# Patient Record
Sex: Male | Born: 1985 | Hispanic: No | Marital: Single | Smoking: Never smoker
Health system: Southern US, Community
[De-identification: ages and names within clinical notes are randomized; demographics above are authoritative.]

## PROBLEM LIST (undated history)

## (undated) DIAGNOSIS — R000 Tachycardia, unspecified: Secondary | ICD-10-CM

## (undated) DIAGNOSIS — K760 Fatty (change of) liver, not elsewhere classified: Secondary | ICD-10-CM

## (undated) DIAGNOSIS — K219 Gastro-esophageal reflux disease without esophagitis: Secondary | ICD-10-CM

## (undated) DIAGNOSIS — G43909 Migraine, unspecified, not intractable, without status migrainosus: Secondary | ICD-10-CM

## (undated) DIAGNOSIS — R7401 Elevation of levels of liver transaminase levels: Secondary | ICD-10-CM

## (undated) DIAGNOSIS — F5102 Adjustment insomnia: Secondary | ICD-10-CM

## (undated) DIAGNOSIS — Z6836 Body mass index (BMI) 36.0-36.9, adult: Secondary | ICD-10-CM

## (undated) DIAGNOSIS — R0789 Other chest pain: Secondary | ICD-10-CM

## (undated) DIAGNOSIS — J45909 Unspecified asthma, uncomplicated: Secondary | ICD-10-CM

## (undated) HISTORY — DX: Elevation of levels of liver transaminase levels: R74.01

## (undated) HISTORY — DX: Adjustment insomnia: F51.02

## (undated) HISTORY — DX: Body mass index (BMI) 36.0-36.9, adult: Z68.36

## (undated) HISTORY — DX: Other chest pain: R07.89

## (undated) HISTORY — DX: Fatty (change of) liver, not elsewhere classified: K76.0

## (undated) HISTORY — DX: Gastro-esophageal reflux disease without esophagitis: K21.9

## (undated) HISTORY — PX: NO PAST SURGERIES: SHX2092

## (undated) HISTORY — DX: Tachycardia, unspecified: R00.0

---

## 2016-03-23 DIAGNOSIS — Z23 Encounter for immunization: Secondary | ICD-10-CM | POA: Diagnosis not present

## 2016-09-18 DIAGNOSIS — S93431A Sprain of tibiofibular ligament of right ankle, initial encounter: Secondary | ICD-10-CM | POA: Diagnosis not present

## 2016-09-18 DIAGNOSIS — Z1389 Encounter for screening for other disorder: Secondary | ICD-10-CM | POA: Diagnosis not present

## 2016-09-18 DIAGNOSIS — Z6835 Body mass index (BMI) 35.0-35.9, adult: Secondary | ICD-10-CM | POA: Diagnosis not present

## 2016-10-18 DIAGNOSIS — Z6835 Body mass index (BMI) 35.0-35.9, adult: Secondary | ICD-10-CM | POA: Diagnosis not present

## 2016-10-18 DIAGNOSIS — Z131 Encounter for screening for diabetes mellitus: Secondary | ICD-10-CM | POA: Diagnosis not present

## 2016-10-18 DIAGNOSIS — L74510 Primary focal hyperhidrosis, axilla: Secondary | ICD-10-CM | POA: Diagnosis not present

## 2016-10-18 DIAGNOSIS — Z0000 Encounter for general adult medical examination without abnormal findings: Secondary | ICD-10-CM | POA: Diagnosis not present

## 2016-11-01 DIAGNOSIS — Z6835 Body mass index (BMI) 35.0-35.9, adult: Secondary | ICD-10-CM | POA: Diagnosis not present

## 2016-11-01 DIAGNOSIS — K120 Recurrent oral aphthae: Secondary | ICD-10-CM | POA: Diagnosis not present

## 2016-11-01 DIAGNOSIS — J309 Allergic rhinitis, unspecified: Secondary | ICD-10-CM | POA: Diagnosis not present

## 2016-12-21 DIAGNOSIS — Z23 Encounter for immunization: Secondary | ICD-10-CM | POA: Diagnosis not present

## 2017-03-18 DIAGNOSIS — J019 Acute sinusitis, unspecified: Secondary | ICD-10-CM | POA: Diagnosis not present

## 2017-03-18 DIAGNOSIS — L02415 Cutaneous abscess of right lower limb: Secondary | ICD-10-CM | POA: Diagnosis not present

## 2017-07-09 DIAGNOSIS — Z6835 Body mass index (BMI) 35.0-35.9, adult: Secondary | ICD-10-CM | POA: Diagnosis not present

## 2017-07-09 DIAGNOSIS — M79671 Pain in right foot: Secondary | ICD-10-CM | POA: Diagnosis not present

## 2017-07-09 DIAGNOSIS — M7731 Calcaneal spur, right foot: Secondary | ICD-10-CM | POA: Diagnosis not present

## 2017-07-16 DIAGNOSIS — M722 Plantar fascial fibromatosis: Secondary | ICD-10-CM | POA: Diagnosis not present

## 2017-07-22 DIAGNOSIS — R262 Difficulty in walking, not elsewhere classified: Secondary | ICD-10-CM | POA: Diagnosis not present

## 2017-07-22 DIAGNOSIS — M25571 Pain in right ankle and joints of right foot: Secondary | ICD-10-CM | POA: Diagnosis not present

## 2017-08-09 DIAGNOSIS — R262 Difficulty in walking, not elsewhere classified: Secondary | ICD-10-CM | POA: Diagnosis not present

## 2017-08-09 DIAGNOSIS — M25571 Pain in right ankle and joints of right foot: Secondary | ICD-10-CM | POA: Diagnosis not present

## 2017-08-14 DIAGNOSIS — M722 Plantar fascial fibromatosis: Secondary | ICD-10-CM | POA: Diagnosis not present

## 2017-09-12 DIAGNOSIS — R262 Difficulty in walking, not elsewhere classified: Secondary | ICD-10-CM | POA: Diagnosis not present

## 2017-09-12 DIAGNOSIS — M25571 Pain in right ankle and joints of right foot: Secondary | ICD-10-CM | POA: Diagnosis not present

## 2017-09-17 DIAGNOSIS — M722 Plantar fascial fibromatosis: Secondary | ICD-10-CM | POA: Diagnosis not present

## 2017-10-07 DIAGNOSIS — R112 Nausea with vomiting, unspecified: Secondary | ICD-10-CM | POA: Diagnosis not present

## 2017-10-07 DIAGNOSIS — Z6833 Body mass index (BMI) 33.0-33.9, adult: Secondary | ICD-10-CM | POA: Diagnosis not present

## 2017-10-07 DIAGNOSIS — R197 Diarrhea, unspecified: Secondary | ICD-10-CM | POA: Diagnosis not present

## 2017-10-07 DIAGNOSIS — R1013 Epigastric pain: Secondary | ICD-10-CM | POA: Diagnosis not present

## 2018-03-27 DIAGNOSIS — R221 Localized swelling, mass and lump, neck: Secondary | ICD-10-CM

## 2018-03-27 DIAGNOSIS — R09A2 Foreign body sensation, throat: Secondary | ICD-10-CM

## 2018-03-27 HISTORY — DX: Localized swelling, mass and lump, neck: R22.1

## 2018-03-27 HISTORY — DX: Foreign body sensation, throat: R09.A2

## 2019-07-21 DIAGNOSIS — K219 Gastro-esophageal reflux disease without esophagitis: Secondary | ICD-10-CM

## 2019-07-21 DIAGNOSIS — H93232 Hyperacusis, left ear: Secondary | ICD-10-CM | POA: Insufficient documentation

## 2019-07-21 HISTORY — DX: Hyperacusis, left ear: H93.232

## 2019-07-21 HISTORY — DX: Gastro-esophageal reflux disease without esophagitis: K21.9

## 2019-12-05 ENCOUNTER — Encounter (HOSPITAL_BASED_OUTPATIENT_CLINIC_OR_DEPARTMENT_OTHER): Payer: Self-pay | Admitting: Emergency Medicine

## 2019-12-05 ENCOUNTER — Emergency Department (HOSPITAL_BASED_OUTPATIENT_CLINIC_OR_DEPARTMENT_OTHER)
Admission: EM | Admit: 2019-12-05 | Discharge: 2019-12-06 | Disposition: A | Discharge status: Home / Self Care | Payer: 59 | Attending: Emergency Medicine | Admitting: Emergency Medicine

## 2019-12-05 ENCOUNTER — Other Ambulatory Visit: Payer: Self-pay

## 2019-12-05 ENCOUNTER — Emergency Department (HOSPITAL_BASED_OUTPATIENT_CLINIC_OR_DEPARTMENT_OTHER): Payer: 59

## 2019-12-05 DIAGNOSIS — R110 Nausea: Secondary | ICD-10-CM | POA: Diagnosis not present

## 2019-12-05 DIAGNOSIS — Z8709 Personal history of other diseases of the respiratory system: Secondary | ICD-10-CM | POA: Diagnosis not present

## 2019-12-05 DIAGNOSIS — R079 Chest pain, unspecified: Secondary | ICD-10-CM | POA: Diagnosis present

## 2019-12-05 DIAGNOSIS — R509 Fever, unspecified: Secondary | ICD-10-CM | POA: Diagnosis not present

## 2019-12-05 DIAGNOSIS — R300 Dysuria: Secondary | ICD-10-CM | POA: Insufficient documentation

## 2019-12-05 DIAGNOSIS — B349 Viral infection, unspecified: Secondary | ICD-10-CM | POA: Diagnosis not present

## 2019-12-05 DIAGNOSIS — M791 Myalgia, unspecified site: Secondary | ICD-10-CM | POA: Diagnosis not present

## 2019-12-05 HISTORY — DX: Unspecified asthma, uncomplicated: J45.909

## 2019-12-05 HISTORY — DX: Migraine, unspecified, not intractable, without status migrainosus: G43.909

## 2019-12-05 LAB — BASIC METABOLIC PANEL
Anion gap: 10 (ref 5–15)
BUN: 11 mg/dL (ref 6–20)
CO2: 24 mmol/L (ref 22–32)
Calcium: 8.4 mg/dL — ABNORMAL LOW (ref 8.9–10.3)
Chloride: 105 mmol/L (ref 98–111)
Creatinine, Ser: 0.98 mg/dL (ref 0.61–1.24)
GFR calc Af Amer: >60 mL/min (ref >60)
GFR calc non Af Amer: >60 mL/min (ref >60)
Glucose, Bld: 91 mg/dL (ref 70–99)
Potassium: 3.1 mmol/L — ABNORMAL LOW (ref 3.5–5.1)
Sodium: 139 mmol/L (ref 135–145)

## 2019-12-05 LAB — HEPATIC FUNCTION PANEL
ALT: 46 U/L — ABNORMAL HIGH (ref 0–44)
AST: 30 U/L (ref 15–41)
Albumin: 4.3 g/dL (ref 3.5–5.0)
Alkaline Phosphatase: 64 U/L (ref 38–126)
Bilirubin, Direct: <0.1 mg/dL (ref 0.0–0.2)
Total Bilirubin: 0.5 mg/dL (ref 0.3–1.2)
Total Protein: 7.6 g/dL (ref 6.5–8.1)

## 2019-12-05 LAB — CBC
HCT: 43.4 % (ref 39.0–52.0)
Hemoglobin: 14.5 g/dL (ref 13.0–17.0)
MCH: 29.7 pg (ref 26.0–34.0)
MCHC: 33.4 g/dL (ref 30.0–36.0)
MCV: 88.8 fL (ref 80.0–100.0)
Platelets: 264 10*3/uL (ref 150–400)
RBC: 4.89 MIL/uL (ref 4.22–5.81)
RDW: 12.4 % (ref 11.5–15.5)
WBC: 8 10*3/uL (ref 4.0–10.5)
nRBC: 0 % (ref 0.0–0.2)

## 2019-12-05 LAB — LIPASE, BLOOD: Lipase: 35 U/L (ref 11–51)

## 2019-12-05 IMAGING — CR DG CHEST 2V
2 series · 2 of 2 positions shown · non-contrast
Comparison: None.

CLINICAL DATA: Low-grade fever, chest discomfort

EXAM:
CHEST - 2 VIEW

[w chest pa]
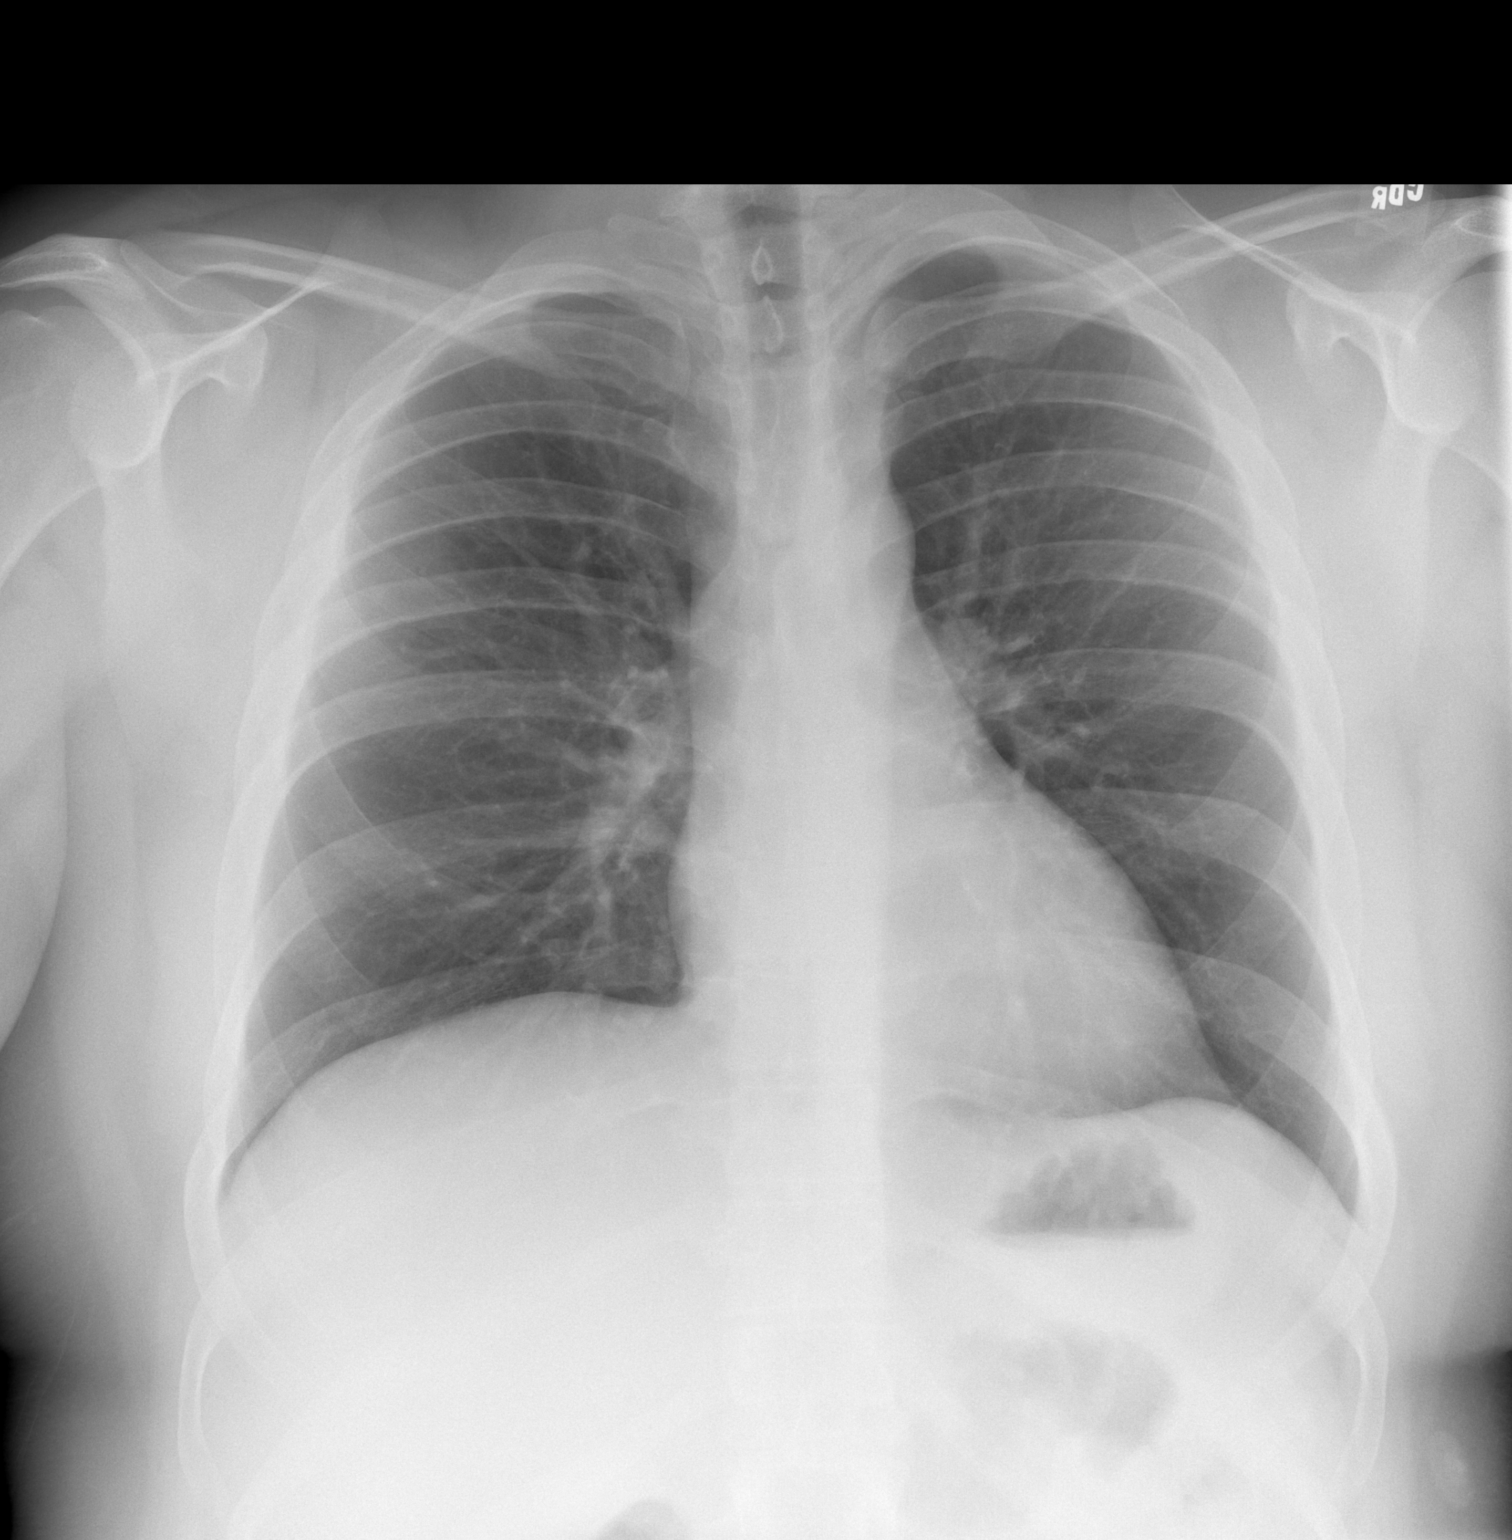

[w chest lat]
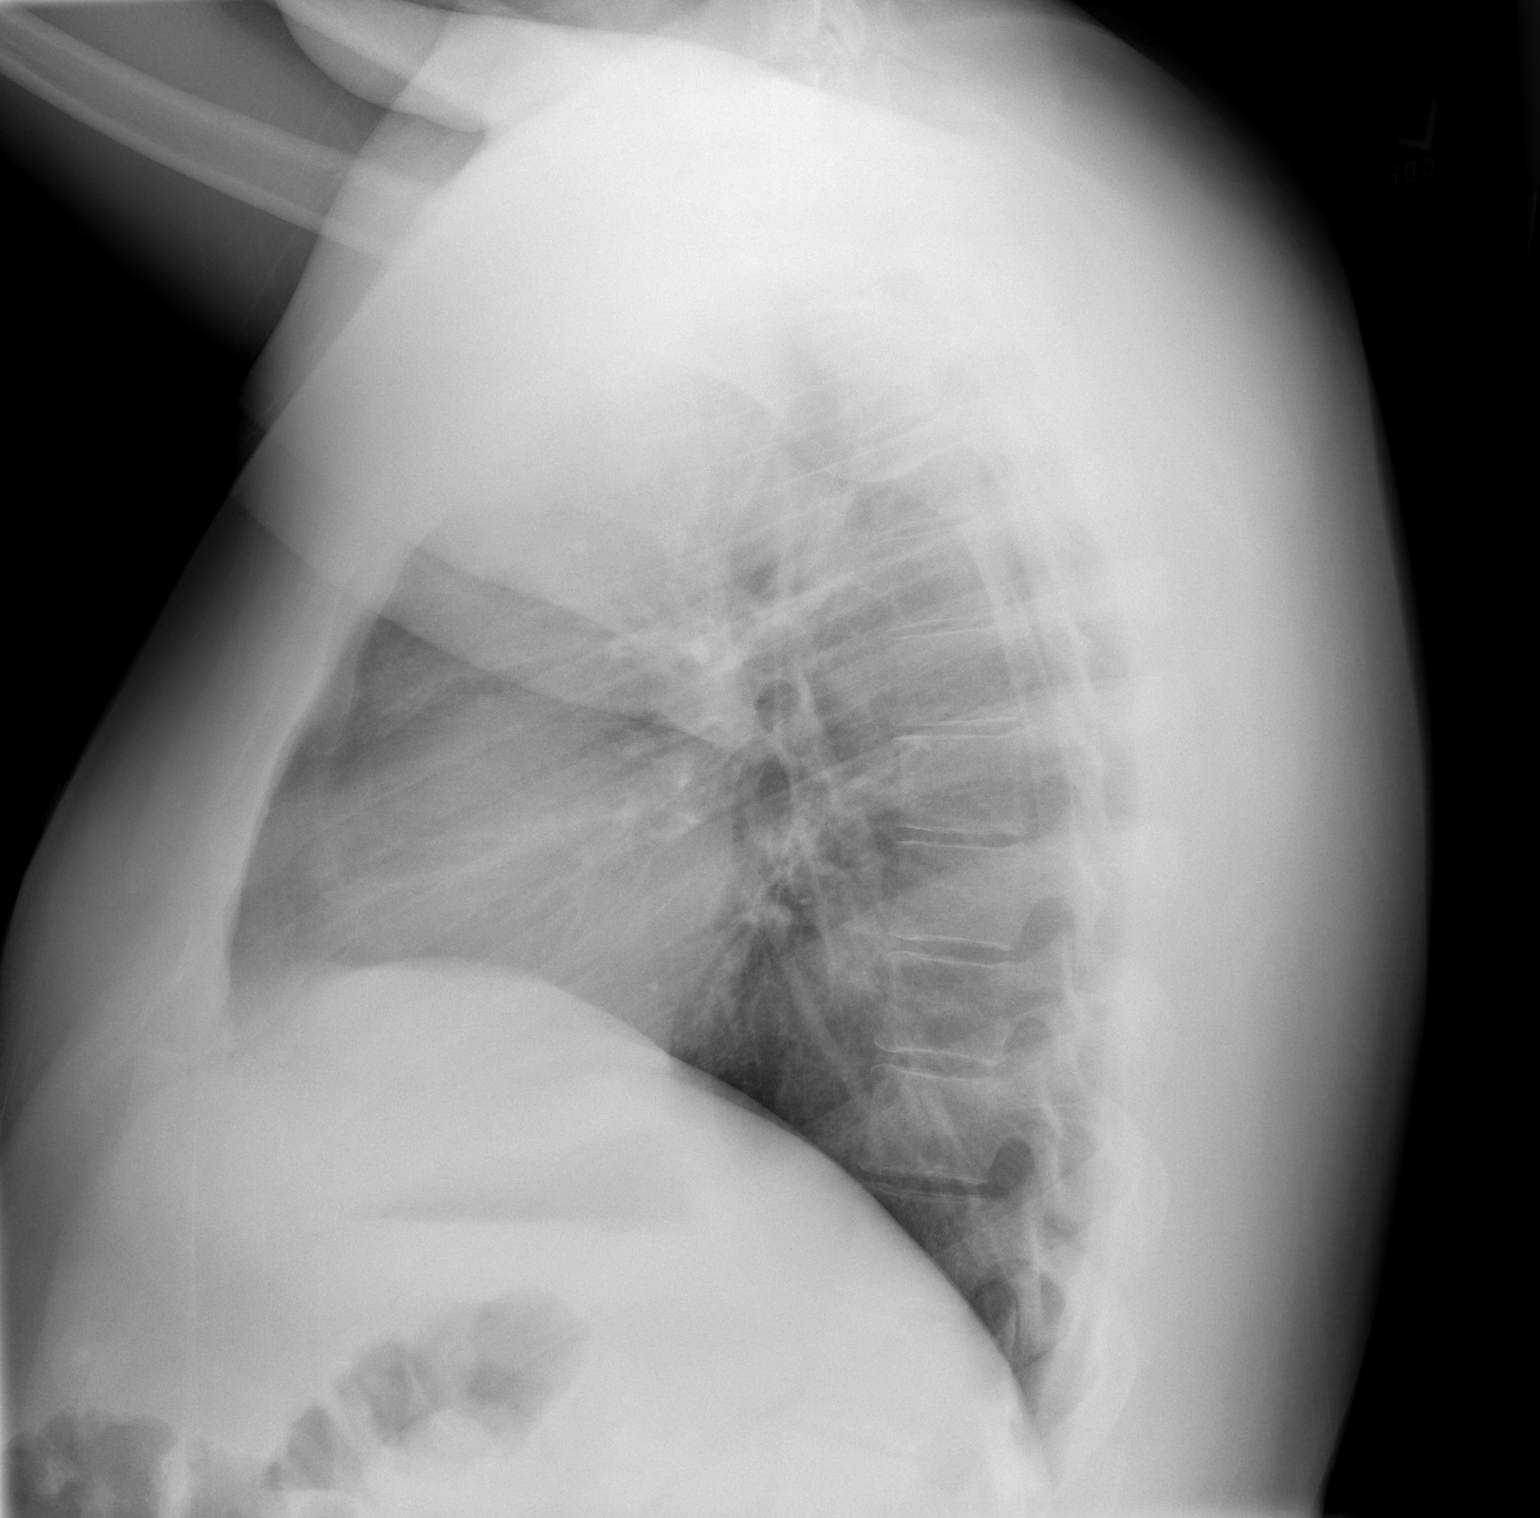

[2 of 2 positions shown; findings below may reference images not displayed]

FINDINGS: The heart size and mediastinal contours are within normal limits.
Both lungs are clear. The visualized skeletal structures are
unremarkable.
IMPRESSION: Normal study.

## 2019-12-05 MED ORDER — SODIUM CHLORIDE 0.9% FLUSH
3.0000 mL | Freq: Once | INTRAVENOUS | Status: DC
  Filled 2019-12-05: qty 3

## 2019-12-05 MED ORDER — LACTATED RINGERS IV BOLUS
1000.0000 mL | Freq: Once | INTRAVENOUS | Status: AC
  Administered 2019-12-05: 1000 mL via INTRAVENOUS

## 2019-12-05 MED ORDER — ONDANSETRON HCL 4 MG/2ML IJ SOLN
4.0000 mg | Freq: Once | INTRAMUSCULAR | Status: AC
  Administered 2019-12-05: 4 mg via INTRAVENOUS
  Filled 2019-12-05: qty 2

## 2019-12-05 NOTE — ED Triage Notes (Addendum)
Chest pressure that started 2 days ago and is intermittent. Pt states his HR has been "high" at home, and a "low grade fever" (temp max 99.2). Also reports 3 episodes of diarrhea today. SHOB that is intermittent but states he does have asthma. Reports muscle weakness and dry mouth.

## 2019-12-05 NOTE — ED Provider Notes (Signed)
[facility] EMERGENCY DEPARTMENT Provider Note   CSN: [id number] Arrival date & time: 12/05/19  1951     History Chief Complaint  Patient presents with  . Chest Pain, Fever    Terry Todd is a 34 y.o. male.  Patient is a 34 year old male with a history of asthma and migraines presenting today with multiple symptoms.  Patient states 3 days ago he developed a migraine headache that seemed a little worse than his typical headache that eventually resolved however yesterday he started having generalized myalgias, arthralgias, poor appetite, nausea.  Today patient has had 3 episodes of diarrhea and now is having chest pain that he describes as pressure that is intermittent and sometimes seems to be worse with laying down, right-sided abdominal pain and occasional dysuria.  He denies cough but has intermittently felt short of breath.  He denies any swelling in his lower extremities or evidence of rash.  No recent medication changes.  Patient has been vaccinated against Covid and has had no known sick contacts.  He denies any bad food exposure and has had no prior abdominal surgeries.  The history is provided by the patient.       Past Medical History:  Diagnosis Date  . Asthma   . Migraine     There are no problems to display for this patient.   History reviewed. No pertinent surgical history.     No family history on file.  Social History   Tobacco Use  . Smoking status: Never Smoker  . Smokeless tobacco: Never Used  Vaping Use  . Vaping Use: Never used  Substance Use Topics  . Alcohol use: Never  . Drug use: Never    Home Medications Prior to Admission medications   Not on File    Allergies    Patient has no known allergies.  Review of Systems   Review of Systems  All other systems reviewed and are negative.   Physical Exam Updated Vital Signs BP (!) 145/109   Pulse 88   Temp 99.1 F (37.3 C) (Oral)   Resp 18   Ht 5\' 11"  (1.803 m)   Wt  117.9 kg   SpO2 100%   BMI 36.26 kg/m   Physical Exam Vitals and nursing note reviewed.  Constitutional:      General: He is not in acute distress.    Appearance: Normal appearance. He is well-developed. He is obese.  HENT:     Head: Normocephalic and atraumatic.     Right Ear: Tympanic membrane normal.     Left Ear: Tympanic membrane normal.     Nose: Nose normal.     Mouth/Throat:     Mouth: Mucous membranes are dry.  Eyes:     Conjunctiva/sclera: Conjunctivae normal.     Pupils: Pupils are equal, round, and reactive to light.  Cardiovascular:     Rate and Rhythm: Normal rate and regular rhythm.     Heart sounds: No murmur heard.   Pulmonary:     Effort: Pulmonary effort is normal. No respiratory distress.     Breath sounds: Normal breath sounds. No wheezing or rales.  Abdominal:     General: There is no distension.     Palpations: Abdomen is soft.     Tenderness: There is abdominal tenderness in the right upper quadrant. There is no guarding or rebound.  Musculoskeletal:        General: No tenderness. Normal range of motion.     Cervical back:  Normal range of motion and neck supple.     Right lower leg: No edema.     Left lower leg: No edema.  Skin:    General: Skin is warm and dry.     Capillary Refill: Capillary refill takes less than 2 seconds.     Findings: No erythema or rash.  Neurological:     General: No focal deficit present.     Mental Status: He is alert and oriented to person, place, and time. Mental status is at baseline.  Psychiatric:        Mood and Affect: Mood normal.        Behavior: Behavior normal.        Thought Content: Thought content normal.     ED Results / Procedures / Treatments   Labs (all labs ordered are listed, but only abnormal results are displayed) Labs Reviewed  BASIC METABOLIC PANEL - Abnormal; Notable for the following components:      Result Value   Potassium 3.1 (*)    Calcium 8.4 (*)    All other components within  normal limits  HEPATIC FUNCTION PANEL - Abnormal; Notable for the following components:   ALT 46 (*)    All other components within normal limits  CBC  LIPASE, BLOOD  TROPONIN I (HIGH SENSITIVITY)  TROPONIN I (HIGH SENSITIVITY)    EKG EKG Interpretation  Date/Time:  Saturday December 05 2019 23:14:41 EDT Ventricular Rate:  87 PR Interval:    QRS Duration: 106 QT Interval:  366 QTC Calculation: 441 R Axis:   115 Text Interpretation: Sinus rhythm Right axis deviation Normal ECG Confirmed by Gwyneth Sprout ([id number]) on 12/06/2019 12:06:20 AM   Radiology DG Chest 2 View  Result Date: 12/05/2019 CLINICAL DATA:  Low-grade fever, chest discomfort EXAM: CHEST - 2 VIEW COMPARISON:  None. FINDINGS: The heart size and mediastinal contours are within normal limits. Both lungs are clear. The visualized skeletal structures are unremarkable. IMPRESSION: Normal study. Electronically Signed   By: Charlett Nose M.D.   On: 12/05/2019 21:12    Procedures Procedures (including critical care time)  Medications Ordered in ED Medications  sodium chloride flush (NS) 0.9 % injection 3 mL (3 mLs Intravenous Not Given 12/05/19 2227)  lactated ringers bolus 1,000 mL (1,000 mLs Intravenous New Bag/Given 12/05/19 2303)  ondansetron (ZOFRAN) injection 4 mg (4 mg Intravenous Given 12/05/19 2257)    ED Course  I have reviewed the triage vital signs and the nursing notes.  Pertinent labs & imaging results that were available during my care of the patient were reviewed by me and considered in my medical decision making (see chart for details).    MDM Rules/Calculators/A&P                          34 year old male presenting today with multiple symptoms seems most classic for viral syndrome.  However patient also has right upper quadrant pain and some intermittent chest pain.  He has had some vomiting and diarrhea and poor oral intake.  Low-grade temperature here of 99 and mild hypertension.  Patient has full  range of motion of neck and no meningeal signs.  He has no rashes or sore throat.  Lungs are clear bilaterally.  Patient has been vaccinated against Covid and low suspicion at this time.  BMP with mild hypokalemia of 3.1 with normal renal function, troponin, hepatic function panel, lipase and EKG are pending.  Patient's chest x-ray is clear.  Patient given IV fluids and Zofran. Labs are reassuring with normal troponin, chest x-ray, CBC.  BMP with mild hypokalemia of 3.1 and discussed increased potassium containing foods with the patient.  ALT slightly elevated but otherwise within normal limits hepatic function panel and lipase within normal limits.  Given all labs are relatively normal suspect this is a viral cause.  Will have patient continue hydration, antiemetics and return if symptoms worsen over the next few days.  MDM Number of Diagnoses or Management Options   Amount and/or Complexity of Data Reviewed Clinical lab tests: ordered and reviewed Tests in the radiology section of CPT: ordered and reviewed Tests in the medicine section of CPT: ordered and reviewed Decide to obtain previous medical records or to obtain history from someone other than the patient: yes Review and summarize past medical records: yes Discuss the patient with other providers: no Independent visualization of images, tracings, or specimens: yes  Risk of Complications, Morbidity, and/or Mortality Presenting problems: moderate Diagnostic procedures: low Management options: low  Patient Progress Patient progress: improved    Final Clinical Impression(s) / ED Diagnoses Final diagnoses:  Acute viral syndrome    Rx / DC Orders ED Discharge Orders    None       Blanchie Dessert, MD 12/07/19 2207

## 2019-12-06 LAB — URINALYSIS, ROUTINE W REFLEX MICROSCOPIC
Bilirubin Urine: NEGATIVE
Glucose, UA: NEGATIVE mg/dL
Hgb urine dipstick: NEGATIVE
Ketones, ur: NEGATIVE mg/dL
Leukocytes,Ua: NEGATIVE
Nitrite: NEGATIVE
Protein, ur: NEGATIVE mg/dL
Specific Gravity, Urine: 1.02 (ref 1.005–1.030)
pH: 6 (ref 5.0–8.0)

## 2019-12-06 LAB — TROPONIN I (HIGH SENSITIVITY)
Troponin I (High Sensitivity): 3 ng/L (ref <18)
Troponin I (High Sensitivity): 3 ng/L (ref <18)

## 2019-12-06 NOTE — ED Notes (Signed)
EDP at bedside  

## 2019-12-06 NOTE — Discharge Instructions (Addendum)
It appears that you are mildly dehydrated today and your potassium is slightly low because of the diarrhea.  Try to eat foods that have lots of potassium and push fluids he did not get dehydrated.  You can use Tylenol as needed for fever and body aches.  This is most likely a viral bug and should get better on its own in the next few days.  However if things worsen or the fever starts getting very high the pain in your abdomen gets worse or any other concerns please return to the emergency room.

## 2020-01-04 ENCOUNTER — Other Ambulatory Visit: Payer: Self-pay

## 2020-01-04 DIAGNOSIS — J45909 Unspecified asthma, uncomplicated: Secondary | ICD-10-CM | POA: Insufficient documentation

## 2020-01-04 DIAGNOSIS — G43909 Migraine, unspecified, not intractable, without status migrainosus: Secondary | ICD-10-CM

## 2020-01-04 HISTORY — DX: Migraine, unspecified, not intractable, without status migrainosus: G43.909

## 2020-01-05 ENCOUNTER — Other Ambulatory Visit: Payer: Self-pay

## 2020-01-05 ENCOUNTER — Encounter: Payer: Self-pay | Admitting: Cardiology

## 2020-01-05 ENCOUNTER — Ambulatory Visit: Payer: 59 | Admitting: Cardiology

## 2020-01-05 VITALS — BP 124/86 | HR 73 | Ht 71.0 in | Wt 267.0 lb

## 2020-01-05 DIAGNOSIS — R0602 Shortness of breath: Secondary | ICD-10-CM

## 2020-01-05 DIAGNOSIS — E669 Obesity, unspecified: Secondary | ICD-10-CM

## 2020-01-05 DIAGNOSIS — R002 Palpitations: Secondary | ICD-10-CM | POA: Insufficient documentation

## 2020-01-05 DIAGNOSIS — I498 Other specified cardiac arrhythmias: Secondary | ICD-10-CM

## 2020-01-05 HISTORY — DX: Other specified cardiac arrhythmias: I49.8

## 2020-01-05 HISTORY — DX: Shortness of breath: R06.02

## 2020-01-05 HISTORY — DX: Obesity, unspecified: E66.9

## 2020-01-05 HISTORY — DX: Palpitations: R00.2

## 2020-01-05 NOTE — Progress Notes (Signed)
Cardiology Office Note:    Date:  01/05/2020   ID:  Terry Todd, DOB [birth year], MRN [id number]  PCP:  Buckner Malta, MD  Cardiologist:  [name], DO  Electrophysiologist:  None   Referring MD: Buckner Malta, MD   " I am having a lot of palpitations"  History of Present Illness:    Terry Todd is a 34 y.o. male with a hx of asthma, anxiety presents today to be evaluated for palpitations.  The patient tells me that for the last several months he has been experiencing palpitations but within the last month it has gotten progressively worse.  His PCP did placed a monitor on him.  And he recently sent the monitor back and is pending results.  We discussed the palpitation abrupt onset of fast heartbeat.  He said he last for few minutes or sometimes he does have shortness of breath associated with it.  He denies any chest pain.  He does tell me that the shortness of breath most times is associated with the palpitations.  Of note the patient's PCP did notice most likely some report of previous right-sided chest pain however the patient has noted that he is not experiencing this any longer.  He tells me recently he has been started on a new anxiety medicine but this does not seem to be helping very much.  Past Medical History:  Diagnosis Date  . Adjustment insomnia   . Asthma   . BMI 36.0-36.9,adult   . Elevated transaminase level   . GERD without esophagitis   . 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 Migraine   . Migraines 01/04/2020  . Right-sided chest wall pain   . Sensation of lump in throat 03/27/2018  . Tachycardia     Past Surgical History:  Procedure Laterality Date  . NO PAST SURGERIES      Current Medications: Current Meds  Medication Sig  . albuterol (VENTOLIN HFA) 108 (90 Base) MCG/ACT inhaler Inhale 2 puffs into the lungs every 4 (four) hours as needed.  . butalbital-acetaminophen-caffeine (FIORICET) 50-325-40 MG tablet Take 1-2 tablets by mouth  every 6 (six) hours as needed.  . cetirizine (ZYRTEC) 10 MG tablet Take 10 mg by mouth at bedtime.  Marland Kitchen escitalopram (LEXAPRO) 5 MG tablet Take 5 mg by mouth daily.  Marland Kitchen omeprazole (PRILOSEC) 40 MG capsule Take 40 mg by mouth daily.  . ondansetron (ZOFRAN-ODT) 8 MG disintegrating tablet Take 8 mg by mouth every 8 (eight) hours as needed.  . terbinafine (LAMISIL) 250 MG tablet Take 250 mg by mouth 2 (two) times daily.     Allergies:   Patient has no known allergies.   Social History   Socioeconomic History  . Marital status: Single    Spouse name: Not on file  . Number of children: Not on file  . Years of education: Not on file  . Highest education level: Not on file  Occupational History  . Not on file  Tobacco Use  . Smoking status: Never Smoker  . Smokeless tobacco: Never Used  Vaping Use  . Vaping Use: Never used  Substance and Sexual Activity  . Alcohol use: Never  . Drug use: Never  . Sexual activity: Not on file  Other Topics Concern  . Not on file  Social History Narrative  . Not on file   Social Determinants of Health   Financial Resource Strain:   . Difficulty of Paying Living Expenses:   Food Insecurity:   .  Worried About Programme researcher, broadcasting/film/videounning Out of Food in the Last Year:   . Baristaan Out of Food in the Last Year:   Transportation Needs:   . Freight forwarderLack of Transportation (Medical):   Marland Kitchen. Lack of Transportation (Non-Medical):   Physical Activity:   . Days of Exercise per Week:   . Minutes of Exercise per Session:   Stress:   . Feeling of Stress :   Social Connections:   . Frequency of Communication with Friends and Family:   . Frequency of Social Gatherings with Friends and Family:   . Attends Religious Services:   . Active Member of Clubs or Organizations:   . Attends BankerClub or Organization Meetings:   Marland Kitchen. Marital Status:      Family History: The patient's family history includes Diabetes Mellitus II in his paternal grandfather and paternal grandmother.  ROS:   Review of Systems   Constitution: Negative for decreased appetite, fever and weight gain.  HENT: Negative for congestion, ear discharge, hoarse voice and sore throat.   Eyes: Negative for discharge, redness, vision loss in right eye and visual halos.  Cardiovascular: Reports dyspnea on exertion and palpitations.  Negative for leg swelling, orthopnea . Respiratory: Negative for cough, hemoptysis, shortness of breath and snoring.   Endocrine: Negative for heat intolerance and polyphagia.  Hematologic/Lymphatic: Negative for bleeding problem. Does not bruise/bleed easily.  Skin: Negative for flushing, nail changes, rash and suspicious lesions.  Musculoskeletal: Negative for arthritis, joint pain, muscle cramps, myalgias, neck pain and stiffness.  Gastrointestinal: Negative for abdominal pain, bowel incontinence, diarrhea and excessive appetite.  Genitourinary: Negative for decreased libido, genital sores and incomplete emptying.  Neurological: Negative for brief paralysis, focal weakness, headaches and loss of balance.  Psychiatric/Behavioral: Negative for altered mental status, depression and suicidal ideas.  Allergic/Immunologic: Negative for HIV exposure and persistent infections.    EKGs/Labs/Other Studies Reviewed:    The following studies were reviewed today:   EKG:  The ekg ordered today demonstrates sinus rhythm, heart rate 70 bpm with arrhythmia.   Recent Labs: 12/05/2019: ALT 46; BUN 11; Creatinine, Ser 0.98; Hemoglobin 14.5; Platelets 264; Potassium 3.1; Sodium 139  Recent Lipid Panel Lipid profile: Triglyceride 163, cholesterol 179, HDL 39, LDL 107  Physical Exam:    VS:  BP 124/86   Pulse 73   Ht 5\' 11"  (1.803 m)   Wt 267 lb (121.1 kg)   SpO2 97%   BMI 37.24 kg/m     Wt Readings from Last 3 Encounters:  01/05/20 267 lb (121.1 kg)  12/05/19 260 lb (117.9 kg)     GEN: Well nourished, well developed in no acute distress HEENT: Normal NECK: No JVD; No carotid bruits LYMPHATICS: No  lymphadenopathy CARDIAC: S1S2 noted,RRR, no murmurs, rubs, gallops RESPIRATORY:  Clear to auscultation without rales, wheezing or rhonchi  ABDOMEN: Soft, non-tender, non-distended, +bowel sounds, no guarding. EXTREMITIES: No edema, No cyanosis, no clubbing MUSCULOSKELETAL:  No deformity  SKIN: Warm and dry NEUROLOGIC:  Alert and oriented x 3, non-focal PSYCHIATRIC:  Normal affect, good insight  ASSESSMENT:    1. Palpitations   2. Sinus arrhythmia   3. Shortness of breath   4. Obesity (BMI 30-39.9)    PLAN:     He recently wore the [employer] monitor and did send this information to the company.  We will try to get this information for review.  For now he defers to be started on any rate reducing agent like propanolol.  Meanwhile while we wait for the monitor, I  will get a transthoracic echocardiogram to assess LV and RV function.  TSH will also be done.  I did educate the patient what it means with the heart when he is in sinus arrhythmia.  All of his questions were answered.  Shortness of breath-echo as noted above.  Obesity-the patient understands the need to lose weight with diet and exercise. We have discussed specific strategies for this.  The patient is in agreement with the above plan. The patient left the office in stable condition.  The patient will follow up in 3 months.   Medication Adjustments/Labs and Tests Ordered: Current medicines are reviewed at length with the patient today.  Concerns regarding medicines are outlined above.  Orders Placed This Encounter  Procedures  . TSH  . EKG 12-Lead  . ECHOCARDIOGRAM COMPLETE   No orders of the defined types were placed in this encounter.   Patient Instructions  Medication Instructions:  No medication changes. *If you need a refill on your cardiac medications before your next appointment, please call your pharmacy*   Lab Work: Your physician recommends that you have a TSH today.  If you have labs (blood work) drawn  today and your tests are completely normal, you will receive your results only by: Marland Kitchen MyChart Message (if you have MyChart) OR . A paper copy in the mail If you have any lab test that is abnormal or we need to change your treatment, we will call you to review the results.   Testing/Procedures: Your physician has requested that you have an echocardiogram. Echocardiography is a painless test that uses sound waves to create images of your heart. It provides your doctor with information about the size and shape of your heart and how well your heart's chambers and valves are working. This procedure takes approximately one hour. There are no restrictions for this procedure.     Follow-Up: At [facility], you and your health needs are our priority.  As part of our continuing mission to provide you with exceptional heart care, we have created designated Provider Care Teams.  These Care Teams include your primary Cardiologist (physician) and Advanced Practice Providers (APPs -  Physician Assistants and Nurse Practitioners) who all work together to provide you with the care you need, when you need it.  We recommend signing up for the patient portal called "MyChart".  Sign up information is provided on this After Visit Summary.  MyChart is used to connect with patients for Virtual Visits (Telemedicine).  Patients are able to view lab/test results, encounter notes, upcoming appointments, etc.  Non-urgent messages can be sent to your provider as well.   To learn more about what you can do with MyChart, go to [email or web address].    Your next appointment:   3 month(s)  The format for your next appointment:   In Person  Provider:   [name], DO   Other Instructions  Echocardiogram An echocardiogram is a procedure that uses painless sound waves (ultrasound) to produce an image of the heart. Images from an echocardiogram can provide important information about:  Signs of coronary artery  disease (CAD).  Aneurysm detection. An aneurysm is a weak or damaged part of an artery wall that bulges out from the normal force of blood pumping through the body.  Heart size and shape. Changes in the size or shape of the heart can be associated with certain conditions, including heart failure, aneurysm, and CAD.  Heart muscle function.  Heart valve function.  Signs of a  past heart attack.  Fluid buildup around the heart.  Thickening of the heart muscle.  A tumor or infectious growth around the heart valves. Tell a health care provider about:  Any allergies you have.  All medicines you are taking, including vitamins, herbs, eye drops, creams, and over-the-counter medicines.  Any blood disorders you have.  Any surgeries you have had.  Any medical conditions you have.  Whether you are pregnant or may be pregnant. What are the risks? Generally, this is a safe procedure. However, problems may occur, including:  Allergic reaction to dye (contrast) that may be used during the procedure. What happens before the procedure? No specific preparation is needed. You may eat and drink normally. What happens during the procedure?   An IV tube may be inserted into one of your veins.  You may receive contrast through this tube. A contrast is an injection that improves the quality of the pictures from your heart.  A gel will be applied to your chest.  A wand-like tool (transducer) will be moved over your chest. The gel will help to transmit the sound waves from the transducer.  The sound waves will harmlessly bounce off of your heart to allow the heart images to be captured in real-time motion. The images will be recorded on a computer. The procedure may vary among health care providers and hospitals. What happens after the procedure?  You may return to your normal, everyday life, including diet, activities, and medicines, unless your health care provider tells you not to do  that. Summary  An echocardiogram is a procedure that uses painless sound waves (ultrasound) to produce an image of the heart.  Images from an echocardiogram can provide important information about the size and shape of your heart, heart muscle function, heart valve function, and fluid buildup around your heart.  You do not need to do anything to prepare before this procedure. You may eat and drink normally.  After the echocardiogram is completed, you may return to your normal, everyday life, unless your health care provider tells you not to do that. This information is not intended to replace advice given to you by your health care provider. Make sure you discuss any questions you have with your health care provider. Document Revised: 10/02/2018 Document Reviewed: 07/14/2016 Elsevier Patient Education  2020 [employer].      Adopting a Healthy Lifestyle.  Know what a healthy weight is for you (roughly BMI <25) and aim to maintain this   Aim for 7+ servings of fruits and vegetables daily   65-80+ fluid ounces of water or unsweet tea for healthy kidneys   Limit to max 1 drink of alcohol per day; avoid smoking/tobacco   Limit animal fats in diet for cholesterol and heart health - choose grass fed whenever available   Avoid highly processed foods, and foods high in saturated/trans fats   Aim for low stress - take time to unwind and care for your mental health   Aim for 150 min of moderate intensity exercise weekly for heart health, and weights twice weekly for bone health   Aim for 7-9 hours of sleep daily   When it comes to diets, agreement about the perfect plan isnt easy to find, even among the experts. Experts at the [facility] [employer] developed an idea known as the Healthy Eating Plate. Just imagine a plate divided into logical, healthy portions.   The emphasis is on diet quality:   Load up on  vegetables and fruits - one-half of your plate: Aim for color and  variety, and remember that potatoes dont count.   Go for whole grains - one-quarter of your plate: Whole wheat, barley, wheat berries, quinoa, oats, brown rice, and foods made with them. If you want pasta, go with whole wheat pasta.   Protein power - one-quarter of your plate: Fish, chicken, beans, and nuts are all healthy, versatile protein sources. Limit red meat.   The diet, however, does go beyond the plate, offering a few other suggestions.   Use healthy plant oils, such as olive, canola, soy, corn, sunflower and peanut. Check the labels, and avoid partially hydrogenated oil, which have unhealthy trans fats.   If youre thirsty, drink water. Coffee and tea are good in moderation, but skip sugary drinks and limit milk and dairy products to one or two daily servings.   The type of carbohydrate in the diet is more important than the amount. Some sources of carbohydrates, such as vegetables, fruits, whole grains, and beans-are healthier than others.   Finally, stay active  Signed, [name], DO  01/05/2020 8:03 PM    [facility]

## 2020-01-05 NOTE — Patient Instructions (Signed)
Medication Instructions:  No medication changes. *If you need a refill on your cardiac medications before your next appointment, please call your pharmacy*   Lab Work: Your physician recommends that you have a TSH today.  If you have labs (blood work) drawn today and your tests are completely normal, you will receive your results only by: Marland Kitchen MyChart Message (if you have MyChart) OR . A paper copy in the mail If you have any lab test that is abnormal or we need to change your treatment, we will call you to review the results.   Testing/Procedures: Your physician has requested that you have an echocardiogram. Echocardiography is a painless test that uses sound waves to create images of your heart. It provides your doctor with information about the size and shape of your heart and how well your heart's chambers and valves are working. This procedure takes approximately one hour. There are no restrictions for this procedure.     Follow-Up: At [facility], you and your health needs are our priority.  As part of our continuing mission to provide you with exceptional heart care, we have created designated Provider Care Teams.  These Care Teams include your primary Cardiologist (physician) and Advanced Practice Providers (APPs -  Physician Assistants and Nurse Practitioners) who all work together to provide you with the care you need, when you need it.  We recommend signing up for the patient portal called "MyChart".  Sign up information is provided on this After Visit Summary.  MyChart is used to connect with patients for Virtual Visits (Telemedicine).  Patients are able to view lab/test results, encounter notes, upcoming appointments, etc.  Non-urgent messages can be sent to your provider as well.   To learn more about what you can do with MyChart, go to [email or web address].    Your next appointment:   3 month(s)  The format for your next appointment:   In Person  Provider:   [name], DO   Other Instructions  Echocardiogram An echocardiogram is a procedure that uses painless sound waves (ultrasound) to produce an image of the heart. Images from an echocardiogram can provide important information about:  Signs of coronary artery disease (CAD).  Aneurysm detection. An aneurysm is a weak or damaged part of an artery wall that bulges out from the normal force of blood pumping through the body.  Heart size and shape. Changes in the size or shape of the heart can be associated with certain conditions, including heart failure, aneurysm, and CAD.  Heart muscle function.  Heart valve function.  Signs of a past heart attack.  Fluid buildup around the heart.  Thickening of the heart muscle.  A tumor or infectious growth around the heart valves. Tell a health care provider about:  Any allergies you have.  All medicines you are taking, including vitamins, herbs, eye drops, creams, and over-the-counter medicines.  Any blood disorders you have.  Any surgeries you have had.  Any medical conditions you have.  Whether you are pregnant or may be pregnant. What are the risks? Generally, this is a safe procedure. However, problems may occur, including:  Allergic reaction to dye (contrast) that may be used during the procedure. What happens before the procedure? No specific preparation is needed. You may eat and drink normally. What happens during the procedure?   An IV tube may be inserted into one of your veins.  You may receive contrast through this tube. A contrast is an injection that improves the quality  of the pictures from your heart.  A gel will be applied to your chest.  A wand-like tool (transducer) will be moved over your chest. The gel will help to transmit the sound waves from the transducer.  The sound waves will harmlessly bounce off of your heart to allow the heart images to be captured in real-time motion. The images will be recorded on a  computer. The procedure may vary among health care providers and hospitals. What happens after the procedure?  You may return to your normal, everyday life, including diet, activities, and medicines, unless your health care provider tells you not to do that. Summary  An echocardiogram is a procedure that uses painless sound waves (ultrasound) to produce an image of the heart.  Images from an echocardiogram can provide important information about the size and shape of your heart, heart muscle function, heart valve function, and fluid buildup around your heart.  You do not need to do anything to prepare before this procedure. You may eat and drink normally.  After the echocardiogram is completed, you may return to your normal, everyday life, unless your health care provider tells you not to do that. This information is not intended to replace advice given to you by your health care provider. Make sure you discuss any questions you have with your health care provider. Document Revised: 10/02/2018 Document Reviewed: 07/14/2016 Elsevier Patient Education  2020 [employer].

## 2020-01-06 LAB — TSH: TSH: 3.78 u[IU]/mL (ref 0.450–4.500)

## 2020-01-07 ENCOUNTER — Telehealth: Payer: Self-pay

## 2020-01-07 NOTE — Telephone Encounter (Signed)
-----   Message from Kardie Tobb, DO sent at 01/07/2020  9:11 AM EDT ----- TSH normal 

## 2020-01-07 NOTE — Telephone Encounter (Signed)
-----   Message from [name], DO sent at 01/07/2020  9:11 AM EDT ----- TSH normal

## 2020-01-07 NOTE — Telephone Encounter (Signed)
Spoke with patient regarding results and recommendation.  Patient verbalizes understanding and is agreeable to plan of care. Advised patient to call back with any issues or concerns.  

## 2020-01-07 NOTE — Telephone Encounter (Signed)
Left message on patients voicemail to please return our call.   

## 2020-01-25 ENCOUNTER — Other Ambulatory Visit: Payer: 59

## 2020-02-05 ENCOUNTER — Other Ambulatory Visit: Payer: Self-pay

## 2020-02-05 ENCOUNTER — Ambulatory Visit (INDEPENDENT_AMBULATORY_CARE_PROVIDER_SITE_OTHER): Payer: 59

## 2020-02-05 DIAGNOSIS — R002 Palpitations: Secondary | ICD-10-CM | POA: Diagnosis not present

## 2020-02-05 DIAGNOSIS — I498 Other specified cardiac arrhythmias: Secondary | ICD-10-CM | POA: Diagnosis not present

## 2020-02-05 LAB — ECHOCARDIOGRAM COMPLETE
Area-P 1/2: 2.87 cm2
S' Lateral: 2.6 cm

## 2020-02-05 NOTE — Progress Notes (Signed)
Complete echocardiogram has been performed.  Jimmy Pryce Folts RDCS, RVT 

## 2020-02-08 ENCOUNTER — Telehealth: Payer: Self-pay

## 2020-02-08 NOTE — Telephone Encounter (Signed)
Spoke with patient regarding results and recommendation.  Patient verbalizes understanding and is agreeable to plan of care. Advised patient to call back with any issues or concerns.  

## 2020-02-08 NOTE — Telephone Encounter (Signed)
-----   Message from [name], DO sent at 02/07/2020  9:41 PM EDT ----- Echo normal.

## 2020-04-07 DIAGNOSIS — R000 Tachycardia, unspecified: Secondary | ICD-10-CM | POA: Insufficient documentation

## 2020-04-07 DIAGNOSIS — F5102 Adjustment insomnia: Secondary | ICD-10-CM | POA: Insufficient documentation

## 2020-04-07 DIAGNOSIS — R0789 Other chest pain: Secondary | ICD-10-CM | POA: Insufficient documentation

## 2020-04-07 DIAGNOSIS — Z6836 Body mass index (BMI) 36.0-36.9, adult: Secondary | ICD-10-CM | POA: Insufficient documentation

## 2020-04-07 DIAGNOSIS — R7401 Elevation of levels of liver transaminase levels: Secondary | ICD-10-CM | POA: Insufficient documentation

## 2020-04-07 DIAGNOSIS — G43909 Migraine, unspecified, not intractable, without status migrainosus: Secondary | ICD-10-CM | POA: Insufficient documentation

## 2020-04-07 DIAGNOSIS — K219 Gastro-esophageal reflux disease without esophagitis: Secondary | ICD-10-CM | POA: Insufficient documentation

## 2020-04-12 ENCOUNTER — Ambulatory Visit: Payer: 59 | Admitting: Cardiology

## 2020-05-09 ENCOUNTER — Other Ambulatory Visit: Payer: Self-pay

## 2020-05-09 ENCOUNTER — Encounter: Payer: Self-pay | Admitting: Cardiology

## 2020-05-09 ENCOUNTER — Ambulatory Visit: Payer: 59 | Admitting: Cardiology

## 2020-05-09 VITALS — BP 132/86 | HR 88 | Ht 71.0 in | Wt 278.6 lb

## 2020-05-09 DIAGNOSIS — Z6836 Body mass index (BMI) 36.0-36.9, adult: Secondary | ICD-10-CM

## 2020-05-09 DIAGNOSIS — R002 Palpitations: Secondary | ICD-10-CM

## 2020-05-09 NOTE — Progress Notes (Signed)
Cardiology Office Note:    Date:  05/09/2020   ID:  Terry Todd, DOB 1985/12/19, MRN [id number]  PCP:  Buckner Malta, MD  Cardiologist:  [name], DO  Electrophysiologist:  None   Referring MD: Buckner Malta, MD   " I am doing fine"   History of Present Illness:    Terry Todd is a 34 y.o. male with a hx anxiety, obesity presented initially to be evaluated for palpitations.  The patient had a monitor which he wore at his primary care office which was unremarkable.  He had an echocardiogram here which was a normal study. He tells me he does have intermittent shortness of breath but does feel like his heart rate goes up into the 1 teens when he get up to walk.  He understands that he needs to exercise more no other complaints at this time  Past Medical History:  Diagnosis Date  . Adjustment insomnia   . Asthma   . BMI 36.0-36.9,adult   . Elevated transaminase level   . GERD without esophagitis   . 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 Migraine   . Migraines 01/04/2020  . Obesity (BMI 30-39.9) 01/05/2020  . Palpitations 01/05/2020  . Right-sided chest wall pain   . Sensation of lump in throat 03/27/2018  . Shortness of breath 01/05/2020  . Sinus arrhythmia 01/05/2020  . Tachycardia     Past Surgical History:  Procedure Laterality Date  . NO PAST SURGERIES      Current Medications: Current Meds  Medication Sig  . albuterol (VENTOLIN HFA) 108 (90 Base) MCG/ACT inhaler Inhale 2 puffs into the lungs every 4 (four) hours as needed.  Marland Kitchen azelastine (ASTELIN) 0.1 % nasal spray Place 2 sprays into both nostrils daily.  . cetirizine (ZYRTEC) 10 MG tablet Take 10 mg by mouth at bedtime.  Marland Kitchen escitalopram (LEXAPRO) 5 MG tablet Take 5 mg by mouth daily.  Marland Kitchen ibuprofen (ADVIL) 600 MG tablet Take 600 mg by mouth as needed.  . methocarbamol (ROBAXIN) 500 MG tablet Take 500 mg by mouth 3 (three) times daily as needed.  Marland Kitchen omeprazole  (PRILOSEC) 40 MG capsule Take 40 mg by mouth daily.     Allergies:   Patient has no known allergies.   Social History   Socioeconomic History  . Marital status: Single    Spouse name: Not on file  . Number of children: Not on file  . Years of education: Not on file  . Highest education level: Not on file  Occupational History  . Not on file  Tobacco Use  . Smoking status: Never Smoker  . Smokeless tobacco: Never Used  Vaping Use  . Vaping Use: Never used  Substance and Sexual Activity  . Alcohol use: Never  . Drug use: Never  . Sexual activity: Not on file  Other Topics Concern  . Not on file  Social History Narrative  . Not on file   Social Determinants of Health   Financial Resource Strain:   . Difficulty of Paying Living Expenses: Not on file  Food Insecurity:   . Worried About Programme researcher, broadcasting/film/video in the Last Year: Not on file  . Ran Out of Food in the Last Year: Not on file  Transportation Needs:   . Lack of Transportation (Medical): Not on file  . Lack of Transportation (Non-Medical): Not on file  Physical Activity:   . Days of Exercise per Week: Not on  file  . Minutes of Exercise per Session: Not on file  Stress:   . Feeling of Stress : Not on file  Social Connections:   . Frequency of Communication with Friends and Family: Not on file  . Frequency of Social Gatherings with Friends and Family: Not on file  . Attends Religious Services: Not on file  . Active Member of Clubs or Organizations: Not on file  . Attends BankerClub or Organization Meetings: Not on file  . Marital Status: Not on file     Family History: The patient's family history includes Diabetes Mellitus II in his paternal grandfather and paternal grandmother.  ROS:   Review of Systems  Constitution: Negative for decreased appetite, fever and weight gain.  HENT: Negative for congestion, ear discharge, hoarse voice and sore throat.   Eyes: Negative for discharge, redness, vision loss in right eye  and visual halos.  Cardiovascular: Negative for chest pain, dyspnea on exertion, leg swelling, orthopnea and palpitations.  Respiratory: Negative for cough, hemoptysis, shortness of breath and snoring.   Endocrine: Negative for heat intolerance and polyphagia.  Hematologic/Lymphatic: Negative for bleeding problem. Does not bruise/bleed easily.  Skin: Negative for flushing, nail changes, rash and suspicious lesions.  Musculoskeletal: Negative for arthritis, joint pain, muscle cramps, myalgias, neck pain and stiffness.  Gastrointestinal: Negative for abdominal pain, bowel incontinence, diarrhea and excessive appetite.  Genitourinary: Negative for decreased libido, genital sores and incomplete emptying.  Neurological: Negative for brief paralysis, focal weakness, headaches and loss of balance.  Psychiatric/Behavioral: Negative for altered mental status, depression and suicidal ideas.  Allergic/Immunologic: Negative for HIV exposure and persistent infections.    EKGs/Labs/Other Studies Reviewed:    The following studies were reviewed today:   EKG:  The ekg ordered today demonstrates   Recent Labs: 12/05/2019: ALT 46; BUN 11; Creatinine, Ser 0.98; Hemoglobin 14.5; Platelets 264; Potassium 3.1; Sodium 139 01/05/2020: TSH 3.780  Recent Lipid Panel No results found for: CHOL, TRIG, HDL, CHOLHDL, VLDL, LDLCALC, LDLDIRECT  Physical Exam:    VS:  BP 132/86   Pulse 88   Ht 5\' 11"  (1.803 m)   Wt 278 lb 9.6 oz (126.4 kg)   SpO2 97%   BMI 38.86 kg/m     Wt Readings from Last 3 Encounters:  05/09/20 278 lb 9.6 oz (126.4 kg)  01/05/20 267 lb (121.1 kg)  12/05/19 260 lb (117.9 kg)     GEN: Well nourished, well developed in no acute distress HEENT: Normal NECK: No JVD; No carotid bruits LYMPHATICS: No lymphadenopathy CARDIAC: S1S2 noted,RRR, no murmurs, rubs, gallops RESPIRATORY:  Clear to auscultation without rales, wheezing or rhonchi  ABDOMEN: Soft, non-tender, non-distended, +bowel  sounds, no guarding. EXTREMITIES: No edema, No cyanosis, no clubbing MUSCULOSKELETAL:  No deformity  SKIN: Warm and dry NEUROLOGIC:  Alert and oriented x 3, non-focal PSYCHIATRIC:  Normal affect, good insight  ASSESSMENT:    1. BMI 36.0-36.9,adult   2. Palpitations    PLAN:     I discussed with the patient that if he is experiencing more palpitations that we could try low-dose propranolol.  We discussed the side effects of this medication for now the patient has declined.  Shared decision we will monitor him and if symptom progresses he will notify my office.  The patient understands the need to lose weight with diet and exercise. We have discussed specific strategies for this.  The patient is in agreement with the above plan. The patient left the office in stable condition.  The  patient will follow up in as needed.   Medication Adjustments/Labs and Tests Ordered: Current medicines are reviewed at length with the patient today.  Concerns regarding medicines are outlined above.  No orders of the defined types were placed in this encounter.  No orders of the defined types were placed in this encounter.   Patient Instructions  Medication Instructions:  No medication changes. *If you need a refill on your cardiac medications before your next appointment, please call your pharmacy*   Lab Work: None ordered If you have labs (blood work) drawn today and your tests are completely normal, you will receive your results only by: Marland Kitchen MyChart Message (if you have MyChart) OR . A paper copy in the mail If you have any lab test that is abnormal or we need to change your treatment, we will call you to review the results.   Testing/Procedures: None ordered   Follow-Up: At [facility], you and your health needs are our priority.  As part of our continuing mission to provide you with exceptional heart care, we have created designated Provider Care Teams.  These Care Teams include your  primary Cardiologist (physician) and Advanced Practice Providers (APPs -  Physician Assistants and Nurse Practitioners) who all work together to provide you with the care you need, when you need it.  We recommend signing up for the patient portal called "MyChart".  Sign up information is provided on this After Visit Summary.  MyChart is used to connect with patients for Virtual Visits (Telemedicine).  Patients are able to view lab/test results, encounter notes, upcoming appointments, etc.  Non-urgent messages can be sent to your provider as well.   To learn more about what you can do with MyChart, go to [email or web address].    Your next appointment:   As needed   The format for your next appointment:   In Person  Provider:   [name], DO   Other Instructions NA     Adopting a Healthy Lifestyle.  Know what a healthy weight is for you (roughly BMI <25) and aim to maintain this   Aim for 7+ servings of fruits and vegetables daily   65-80+ fluid ounces of water or unsweet tea for healthy kidneys   Limit to max 1 drink of alcohol per day; avoid smoking/tobacco   Limit animal fats in diet for cholesterol and heart health - choose grass fed whenever available   Avoid highly processed foods, and foods high in saturated/trans fats   Aim for low stress - take time to unwind and care for your mental health   Aim for 150 min of moderate intensity exercise weekly for heart health, and weights twice weekly for bone health   Aim for 7-9 hours of sleep daily   When it comes to diets, agreement about the perfect plan isnt easy to find, even among the experts. Experts at the [facility] of [employer] developed an idea known as the Healthy Eating Plate. Just imagine a plate divided into logical, healthy portions.   The emphasis is on diet quality:   Load up on vegetables and fruits - one-half of your plate: Aim for color and variety, and remember that potatoes dont count.    Go for whole grains - one-quarter of your plate: Whole wheat, barley, wheat berries, quinoa, oats, brown rice, and foods made with them. If you want pasta, go with whole wheat pasta.   Protein power - one-quarter of your plate: Fish, chicken, beans, and nuts  are all healthy, versatile protein sources. Limit red meat.   The diet, however, does go beyond the plate, offering a few other suggestions.   Use healthy plant oils, such as olive, canola, soy, corn, sunflower and peanut. Check the labels, and avoid partially hydrogenated oil, which have unhealthy trans fats.   If youre thirsty, drink water. Coffee and tea are good in moderation, but skip sugary drinks and limit milk and dairy products to one or two daily servings.   The type of carbohydrate in the diet is more important than the amount. Some sources of carbohydrates, such as vegetables, fruits, whole grains, and beans-are healthier than others.   Finally, stay active  Signed, [name], DO  05/09/2020 2:54 PM    [facility]

## 2020-05-09 NOTE — Patient Instructions (Signed)
Medication Instructions:  No medication changes. *If you need a refill on your cardiac medications before your next appointment, please call your pharmacy*   Lab Work: None ordered If you have labs (blood work) drawn today and your tests are completely normal, you will receive your results only by: . MyChart Message (if you have MyChart) OR . A paper copy in the mail If you have any lab test that is abnormal or we need to change your treatment, we will call you to review the results.   Testing/Procedures: None ordered   Follow-Up: At [facility], you and your health needs are our priority.  As part of our continuing mission to provide you with exceptional heart care, we have created designated Provider Care Teams.  These Care Teams include your primary Cardiologist (physician) and Advanced Practice Providers (APPs -  Physician Assistants and Nurse Practitioners) who all work together to provide you with the care you need, when you need it.  We recommend signing up for the patient portal called "MyChart".  Sign up information is provided on this After Visit Summary.  MyChart is used to connect with patients for Virtual Visits (Telemedicine).  Patients are able to view lab/test results, encounter notes, upcoming appointments, etc.  Non-urgent messages can be sent to your provider as well.   To learn more about what you can do with MyChart, go to [email or web address].    Your next appointment:   As needed   The format for your next appointment:   In Person  Provider:   Kardie Tobb, DO   Other Instructions NA  

## 2020-05-24 DIAGNOSIS — J323 Chronic sphenoidal sinusitis: Secondary | ICD-10-CM | POA: Insufficient documentation

## 2020-09-09 DIAGNOSIS — R448 Other symptoms and signs involving general sensations and perceptions: Secondary | ICD-10-CM | POA: Insufficient documentation

## 2020-11-04 ENCOUNTER — Ambulatory Visit: Payer: 59 | Admitting: Cardiology

## 2020-11-04 ENCOUNTER — Encounter: Payer: Self-pay | Admitting: Cardiology

## 2020-11-04 ENCOUNTER — Other Ambulatory Visit: Payer: Self-pay

## 2020-11-04 VITALS — BP 142/98 | HR 100 | Ht 66.0 in | Wt 279.8 lb

## 2020-11-04 DIAGNOSIS — R0789 Other chest pain: Secondary | ICD-10-CM | POA: Diagnosis not present

## 2020-11-04 DIAGNOSIS — R002 Palpitations: Secondary | ICD-10-CM

## 2020-11-04 DIAGNOSIS — I10 Essential (primary) hypertension: Secondary | ICD-10-CM | POA: Diagnosis not present

## 2020-11-04 DIAGNOSIS — R000 Tachycardia, unspecified: Secondary | ICD-10-CM

## 2020-11-04 MED ORDER — PROPRANOLOL HCL 10 MG PO TABS: 10.0000 mg | ORAL_TABLET | Freq: Two times a day (BID) | ORAL | 3 refills | Status: DC

## 2020-11-04 NOTE — Patient Instructions (Signed)
Medication Instructions:  Your physician has recommended you make the following change in your medication: START: Propranolol 10 mg twice daily *If you need a refill on your cardiac medications before your next appointment, please call your pharmacy*   Lab Work: None If you have labs (blood work) drawn today and your tests are completely normal, you will receive your results only by: Marland Kitchen MyChart Message (if you have MyChart) OR . A paper copy in the mail If you have any lab test that is abnormal or we need to change your treatment, we will call you to review the results.   Testing/Procedures: None   Follow-Up: At [facility], you and your health needs are our priority.  As part of our continuing mission to provide you with exceptional heart care, we have created designated Provider Care Teams.  These Care Teams include your primary Cardiologist (physician) and Advanced Practice Providers (APPs -  Physician Assistants and Nurse Practitioners) who all work together to provide you with the care you need, when you need it.  We recommend signing up for the patient portal called "MyChart".  Sign up information is provided on this After Visit Summary.  MyChart is used to connect with patients for Virtual Visits (Telemedicine).  Patients are able to view lab/test results, encounter notes, upcoming appointments, etc.  Non-urgent messages can be sent to your provider as well.   To learn more about what you can do with MyChart, go to [email or web address].    Your next appointment:   8 week(s)  The format for your next appointment:   In Person  Provider:   [name], DO   Other Instructions [email or web address]        FDA-cleared, clinical grade mobile EKG monitor: Lourena Simmonds is the most clinically-validated mobile EKG used by the world's leading cardiac care medical professionals With Basic service, know instantly if your heart rhythm is  normal or if atrial fibrillation is detected, and email the last single EKG recording to yourself or your doctor Premium service, available for purchase through the Kardia app for $9.99 per month or $99 per year, includes unlimited history and storage of your EKG recordings, a monthly EKG summary report to share with your doctor, along with the ability to track your blood pressure, activity and weight Includes one KardiaMobile phone clip FREE SHIPPING: Standard delivery 1-3 business days. Orders placed by 11:00am PST will ship that afternoon. Otherwise, will ship next business day. All orders ship via [employer] from [place], [place]

## 2020-11-04 NOTE — Progress Notes (Signed)
Cardiology Office Note:    Date:  11/04/2020   ID:  Terry Todd, DOB 1986-02-13, MRN [id number]  PCP:  Buckner Malta, MD  Cardiologist:  [name], DO  Electrophysiologist:  None   Referring MD: Buckner Malta, MD   I had the episode ended up in the ED  History of Present Illness:    Terry Todd is a 35 y.o. male with a hx of anxiety, obesity is here today for follow-up visit.  When I last saw the patient in November 2021 he was experiencing some palpitations I offered propanolol but the patient declined.  He is here today for follow-up visit.  The patient is here today for follow-up visit.  He tells me that he was at his PCP office for an she noted him to be in sinus tachycardia his heart rate was in the 1 teens.  He then was recommended to go to the emergency department at the emergency department he had a work-up which his chest CTA was normal he noted that he had some chest pain mostly palpitations started.  He was hypertensive so he was started on hydrochlorothiazide 12.5 mg daily.  He was also given propanolol 10 mg for his palpitations.  He notes that he gets these palpitations and experiences associated chest tightness currently.  Nothing makes it better or worse.  Passes with time.  He has not started the propanolol because he wanted to discuss it at his visit today.  Past Medical History:  Diagnosis Date  . Adjustment insomnia   . Asthma   . BMI 36.0-36.9,adult   . Elevated transaminase level   . GERD without esophagitis   . 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 Migraine   . Migraines 01/04/2020  . Obesity (BMI 30-39.9) 01/05/2020  . Palpitations 01/05/2020  . Right-sided chest wall pain   . Sensation of lump in throat 03/27/2018  . Shortness of breath 01/05/2020  . Sinus arrhythmia 01/05/2020  . Tachycardia     Past Surgical History:  Procedure Laterality Date  . NO PAST SURGERIES      Current  Medications: Current Meds  Medication Sig  . albuterol (VENTOLIN HFA) 108 (90 Base) MCG/ACT inhaler Inhale 2 puffs into the lungs every 4 (four) hours as needed.  . cetirizine (ZYRTEC) 10 MG tablet Take 10 mg by mouth at bedtime.  . hydrochlorothiazide (MICROZIDE) 12.5 MG capsule Take 12.5 mg by mouth daily.  Marland Kitchen ibuprofen (ADVIL) 600 MG tablet Take 600 mg by mouth as needed.  . methocarbamol (ROBAXIN) 500 MG tablet Take 500 mg by mouth 3 (three) times daily as needed.  Marland Kitchen omeprazole (PRILOSEC) 40 MG capsule Take 40 mg by mouth daily.  . propranolol (INDERAL) 10 MG tablet Take 1 tablet (10 mg total) by mouth 2 (two) times daily.  . [DISCONTINUED] propranolol (INDERAL) 10 MG tablet Take 10 mg by mouth 3 (three) times daily.     Allergies:   Patient has no known allergies.   Social History   Socioeconomic History  . Marital status: Single    Spouse name: Not on file  . Number of children: Not on file  . Years of education: Not on file  . Highest education level: Not on file  Occupational History  . Not on file  Tobacco Use  . Smoking status: Never Smoker  . Smokeless tobacco: Never Used  Vaping Use  . Vaping Use: Never used  Substance and Sexual Activity  .  Alcohol use: Never  . Drug use: Never  . Sexual activity: Not on file  Other Topics Concern  . Not on file  Social History Narrative  . Not on file   Social Determinants of Health   Financial Resource Strain: Not on file  Food Insecurity: Not on file  Transportation Needs: Not on file  Physical Activity: Not on file  Stress: Not on file  Social Connections: Not on file     Family History: The patient's family history includes Diabetes Mellitus II in his paternal grandfather and paternal grandmother.  ROS:   Review of Systems  Constitution: Negative for decreased appetite, fever and weight gain.  HENT: Negative for congestion, ear discharge, hoarse voice and sore throat.   Eyes: Negative for discharge, redness,  vision loss in right eye and visual halos.  Cardiovascular: Negative for chest pain, dyspnea on exertion, leg swelling, orthopnea and palpitations.  Respiratory: Negative for cough, hemoptysis, shortness of breath and snoring.   Endocrine: Negative for heat intolerance and polyphagia.  Hematologic/Lymphatic: Negative for bleeding problem. Does not bruise/bleed easily.  Skin: Negative for flushing, nail changes, rash and suspicious lesions.  Musculoskeletal: Negative for arthritis, joint pain, muscle cramps, myalgias, neck pain and stiffness.  Gastrointestinal: Negative for abdominal pain, bowel incontinence, diarrhea and excessive appetite.  Genitourinary: Negative for decreased libido, genital sores and incomplete emptying.  Neurological: Negative for brief paralysis, focal weakness, headaches and loss of balance.  Psychiatric/Behavioral: Negative for altered mental status, depression and suicidal ideas.  Allergic/Immunologic: Negative for HIV exposure and persistent infections.    EKGs/Labs/Other Studies Reviewed:    The following studies were reviewed today:   EKG: I reviewed his EKG which was done in the emergency department at [facility] shows sinus tachycardia.  CTA of the chest done at [facility] on Nov 02, 2020 impression no evidence of pulmonary embolism.  Fatty liver.  No acute abnormalities noted.   Recent Labs: 12/05/2019: ALT 46; BUN 11; Creatinine, Ser 0.98; Hemoglobin 14.5; Platelets 264; Potassium 3.1; Sodium 139 01/05/2020: TSH 3.780  Recent Lipid Panel No results found for: CHOL, TRIG, HDL, CHOLHDL, VLDL, LDLCALC, LDLDIRECT  Physical Exam:    VS:  BP (!) 142/98   Pulse 100   Ht 5\' 6"  (1.676 m)   Wt 279 lb 12.8 oz (126.9 kg)   SpO2 96%   BMI 45.16 kg/m     Wt Readings from Last 3 Encounters:  11/04/20 279 lb 12.8 oz (126.9 kg)  05/09/20 278 lb 9.6 oz (126.4 kg)  01/05/20 267 lb (121.1 kg)     GEN: Well nourished, well developed in no acute  distress HEENT: Normal NECK: No JVD; No carotid bruits LYMPHATICS: No lymphadenopathy CARDIAC: S1S2 noted,RRR, no murmurs, rubs, gallops RESPIRATORY:  Clear to auscultation without rales, wheezing or rhonchi  ABDOMEN: Soft, non-tender, non-distended, +bowel sounds, no guarding. EXTREMITIES: No edema, No cyanosis, no clubbing MUSCULOSKELETAL:  No deformity  SKIN: Warm and dry NEUROLOGIC:  Alert and oriented x 3, non-focal PSYCHIATRIC:  Normal affect, good insight  ASSESSMENT:    1. Chest pain, atypical   2. Hypertension, unspecified type   3. Palpitations   4. Morbid obesity (HCC)   5. Sinus tachycardia    PLAN:    He is hypertensive in our office today his blood pressure was manually taken by me.  I asked him to continue his hydrochlorothiazide 12.5 mg daily.  I talked the patient to cut back on salt.  The patient replaced on his propanolol  10 mg twice a day.  Holding off on going up on any medication doses until he started his medicines and see how it affects his blood pressure.  We discussed 1 week from today he is going to start taking his blood pressure and he was sent that information to me and if needed will adjust his medications as appropriate.  The patient understands the need to lose weight with diet and exercise. We have discussed specific strategies for this.  He had questions about his [facility] at [facility] which we were able to discuss today.  We will repeat his BMP, CBC at his next office visit.  His chest pain is atypical which I think is associated with his palpitations.  We will continue to monitor.  I also asked the patient to get a  Kardiamobile which she can be able to monitor his heart rate and rhythm at home.  The patient is in agreement with the above plan. The patient left the office in stable condition.  The patient will follow up in 8 weeks or sooner if needed.   Medication Adjustments/Labs and Tests Ordered: Current medicines are reviewed at  length with the patient today.  Concerns regarding medicines are outlined above.  No orders of the defined types were placed in this encounter.  Meds ordered this encounter  Medications  . propranolol (INDERAL) 10 MG tablet    Sig: Take 1 tablet (10 mg total) by mouth 2 (two) times daily.    Dispense:  180 tablet    Refill:  3    Patient Instructions  Medication Instructions:  Your physician has recommended you make the following change in your medication: START: Propranolol 10 mg twice daily *If you need a refill on your cardiac medications before your next appointment, please call your pharmacy*   Lab Work: None If you have labs (blood work) drawn today and your tests are completely normal, you will receive your results only by: Marland Kitchen. MyChart Message (if you have MyChart) OR . A paper copy in the mail If you have any lab test that is abnormal or we need to change your treatment, we will call you to review the results.   Testing/Procedures: None   Follow-Up: At [facility], you and your health needs are our priority.  As part of our continuing mission to provide you with exceptional heart care, we have created designated Provider Care Teams.  These Care Teams include your primary Cardiologist (physician) and Advanced Practice Providers (APPs -  Physician Assistants and Nurse Practitioners) who all work together to provide you with the care you need, when you need it.  We recommend signing up for the patient portal called "MyChart".  Sign up information is provided on this After Visit Summary.  MyChart is used to connect with patients for Virtual Visits (Telemedicine).  Patients are able to view lab/test results, encounter notes, upcoming appointments, etc.  Non-urgent messages can be sent to your provider as well.   To learn more about what you can do with MyChart, go to [email or web address].    Your next appointment:   8 week(s)  The format for your next appointment:   In  Person  Provider:   Thomasene RippleKardie Icey Tello, DO   Other Instructions KardiaMobile [email or web address]        FDA-cleared, clinical grade mobile EKG monitor: Lourena SimmondsKardia is the most clinically-validated mobile EKG used by the world's leading cardiac care medical professionals With Basic service, know instantly if your heart rhythm is normal  or if atrial fibrillation is detected, and email the last single EKG recording to yourself or your doctor Premium service, available for purchase through the Kardia app for $9.99 per month or $99 per year, includes unlimited history and storage of your EKG recordings, a monthly EKG summary report to share with your doctor, along with the ability to track your blood pressure, activity and weight Includes one KardiaMobile phone clip FREE SHIPPING: Standard delivery 1-3 business days. Orders placed by 11:00am PST will ship that afternoon. Otherwise, will ship next business day. All orders ship via [employer] from [place], [place]          Adopting a Healthy Lifestyle.  Know what a healthy weight is for you (roughly BMI <25) and aim to maintain this   Aim for 7+ servings of fruits and vegetables daily   65-80+ fluid ounces of water or unsweet tea for healthy kidneys   Limit to max 1 drink of alcohol per day; avoid smoking/tobacco   Limit animal fats in diet for cholesterol and heart health - choose grass fed whenever available   Avoid highly processed foods, and foods high in saturated/trans fats   Aim for low stress - take time to unwind and care for your mental health   Aim for 150 min of moderate intensity exercise weekly for heart health, and weights twice weekly for bone health   Aim for 7-9 hours of sleep daily   When it comes to diets, agreement about the perfect plan isnt easy to find, even among the experts. Experts at the [facility] [employer] developed an idea known as the Healthy Eating Plate. Just  imagine a plate divided into logical, healthy portions.   The emphasis is on diet quality:   Load up on vegetables and fruits - one-half of your plate: Aim for color and variety, and remember that potatoes dont count.   Go for whole grains - one-quarter of your plate: Whole wheat, barley, wheat berries, quinoa, oats, brown rice, and foods made with them. If you want pasta, go with whole wheat pasta.   Protein power - one-quarter of your plate: Fish, chicken, beans, and nuts are all healthy, versatile protein sources. Limit red meat.   The diet, however, does go beyond the plate, offering a few other suggestions.   Use healthy plant oils, such as olive, canola, soy, corn, sunflower and peanut. Check the labels, and avoid partially hydrogenated oil, which have unhealthy trans fats.   If youre thirsty, drink water. Coffee and tea are good in moderation, but skip sugary drinks and limit milk and dairy products to one or two daily servings.   The type of carbohydrate in the diet is more important than the amount. Some sources of carbohydrates, such as vegetables, fruits, whole grains, and beans-are healthier than others.   Finally, stay active  Signed, [name], DO  11/04/2020 9:49 AM    [facility]

## 2020-12-13 ENCOUNTER — Ambulatory Visit: Payer: 59 | Admitting: Cardiology

## 2020-12-23 ENCOUNTER — Ambulatory Visit: Payer: 59 | Admitting: Cardiology

## 2021-01-09 ENCOUNTER — Ambulatory Visit (INDEPENDENT_AMBULATORY_CARE_PROVIDER_SITE_OTHER): Payer: 59 | Admitting: Allergy and Immunology

## 2021-01-09 ENCOUNTER — Encounter: Payer: Self-pay | Admitting: Allergy and Immunology

## 2021-01-09 ENCOUNTER — Other Ambulatory Visit: Payer: Self-pay

## 2021-01-09 VITALS — BP 132/102 | HR 92 | Resp 22 | Ht 70.3 in | Wt 280.0 lb

## 2021-01-09 DIAGNOSIS — J4540 Moderate persistent asthma, uncomplicated: Secondary | ICD-10-CM | POA: Diagnosis not present

## 2021-01-09 DIAGNOSIS — J301 Allergic rhinitis due to pollen: Secondary | ICD-10-CM | POA: Diagnosis not present

## 2021-01-09 DIAGNOSIS — J3089 Other allergic rhinitis: Secondary | ICD-10-CM

## 2021-01-09 DIAGNOSIS — K219 Gastro-esophageal reflux disease without esophagitis: Secondary | ICD-10-CM

## 2021-01-09 DIAGNOSIS — G43909 Migraine, unspecified, not intractable, without status migrainosus: Secondary | ICD-10-CM

## 2021-01-09 DIAGNOSIS — K529 Noninfective gastroenteritis and colitis, unspecified: Secondary | ICD-10-CM

## 2021-01-09 DIAGNOSIS — I10 Essential (primary) hypertension: Secondary | ICD-10-CM

## 2021-01-09 MED ORDER — MONTELUKAST SODIUM 10 MG PO TABS: 10.0000 mg | ORAL_TABLET | Freq: Every day | ORAL | 1 refills | Status: DC

## 2021-01-09 MED ORDER — AIRDUO DIGIHALER 113-14 MCG/ACT IN AEPB: INHALATION_SPRAY | RESPIRATORY_TRACT | 5 refills | Status: DC

## 2021-01-09 NOTE — Patient Instructions (Addendum)
  1.  Allergen avoidance measures - pollens, mold, dust mite  2.  Treat and prevent inflammation:  A. Montelukast 10 mg - 1 tablet 1 time per day B. AirDuo 113 - 1 inhalation 2 times per day (empty lungs) C. OTC Nasacort - 1 spray each nostril 1 time per day  3.  Treat and prevent reflux/LPR:  A. Omeprazole 40 mg - 1 tablet 2 times per day B. Slowly decrease caffeine consumption  4.  Treat and prevent headache:  A. Slowly decrease caffeine consumption  5.  Treat and prevent hypertension:  A. Aim for ideal body weight B. Engage in progressive aerobic exercise program  6.  If needed:  A. Albuterol HFA - 2 inhalations every 4-6 hours B. Loratadine 10 mg - 1 tablet 1-2 times per day  7.  Blood - alpha-gal panel, CBC w/D, CMP, Amylase  8.  Lactose intolerance?  9.  Further evaluation for GI issue?  10.  Return to clinic in 4 weeks or earlier if problem

## 2021-01-09 NOTE — Progress Notes (Signed)
[place] - [place] - [place] - [place] - [place]   Dear Dr. York Grice,  Thank you for referring Terry Todd to the [facility] [place] on 01/09/2021.   Below is a summation of this patient's evaluation and recommendations.  Thank you for your referral. I will keep you informed about this patient's response to treatment.   If you have any questions please do not hesitate to contact me.   Sincerely,  Jessica Priest, MD Allergy / Immunology [facility]   ______________________________________________________________________    NEW PATIENT NOTE  Referring Provider: Buckner Malta, MD Primary Provider: Philemon Kingdom, MD Date of office visit: 01/09/2021    Subjective:   Chief Complaint:  Terry Todd (DOB: 1985-11-24) is a 35 y.o. male who presents to the clinic on 01/09/2021 with a chief complaint of Asthma .     HPI: Terry Todd presents to this clinic in evaluation of several issues.  First, he has a long history of asthma dating back to childhood and since he has been in this area over the course of the past 5 years he may have had a little bit more coughing and shortness of breath but overall still had this had very good control until January 2022 at which point in in time he contracted COVID.  He had a very significant cough and shortness of breath during COVID infection for which he obtained a chest x-ray which was normal and he was given a steroid injection and prednisone in February and apparently everything resolved but in early April 2022 he once again developed rather significant shortness of breath along with some fatigue and wheezing and he required another steroid injection and the delivery of montelukast at the urgent care center.  Currently he still has wheezing and shortness of breath and chest tightness and uses a bronchodilator twice a day and he does respond to a  short acting bronchodilator on a transient basis.  In the past exposure to dust and dog caused some of these issues.  Second, he does have nasal congestion and burning in his nose and occasional sneezing and this also appears to be precipitated by dust and dog exposure.  He does not have a history of anosmia or ugly nasal discharge.  He does have headaches and he has been diagnosed with migraine headaches by a neurologist who apparently performed an imaging study of his upper airway which identified "cysts in his sinuses".  Third, he develops these acute onset episodes of coughing/choking associated with throat clearing that appear to occur on an intermittent basis.  He does have rather significant reflux requiring the administration of a proton pump inhibitor twice a day.  He does consume sweet tea at a rate of about 4 glasses/day and has no other sources of caffeine.  Fourth, for the past month or so he has been developing episodes of abdominal cramping and occasionally diarrhea with the cramping occurring sometimes after eating and sometimes 2 hours after a meal with a frequency of 3 times per week with no other associated systemic or constitutional symptoms.  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   hydrochlorothiazide 12.5 MG capsule Commonly known as: MICROZIDE Take 12.5 mg by mouth daily.   ibuprofen 600 MG tablet Commonly known as: ADVIL Take 600 mg by mouth as needed.   methocarbamol 500 MG tablet Commonly known as: ROBAXIN Take 500 mg by mouth 3 (three) times daily as needed.   omeprazole 40 MG capsule Commonly known as: PRILOSEC Take 40 mg by mouth daily.        Review of systems negative except as noted in HPI / PMHx or noted below:  Review of Systems  Constitutional: Negative.   HENT: Negative.    Eyes: Negative.   Respiratory: Negative.    Cardiovascular: Negative.   Gastrointestinal: Negative.   Genitourinary: Negative.   Musculoskeletal: Negative.   Skin: Negative.   Neurological: Negative.   Endo/Heme/Allergies: Negative.   Psychiatric/Behavioral: Negative.     Family History  Problem Relation Age of Onset   Cirrhosis Mother    Hepatitis C Mother    High Cholesterol Father    Kidney Stones Father    Asthma Brother    Migraines Brother    Diabetes Mellitus II Paternal Grandmother    Diabetes Mellitus II Paternal Grandfather     Social History   Socioeconomic History   Marital status: Single    Spouse name: Not on file   Number of children: Not on file   Years of education: Not on file   Highest education level: Not on file  Occupational History   Not on file  Tobacco Use   Smoking status: Never   Smokeless tobacco: Never  Vaping Use   Vaping Use: Never used  Substance and Sexual Activity   Alcohol use: Never   Drug use: Never   Sexual activity: Not on file  Other Topics Concern   Not on file  Social History Narrative   Not on file   Environmental and Social history  Lives in a apartment with a dry environment, a dog and cat located inside the household, carpet in the bedroom, no plastic on the bed, no plastic on the pillow, and no smoking ongoing with inside the  household.  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  Objective:   Vitals:   01/09/21 1458 01/09/21 1556  BP: (!) 140/100 (!) 132/102  Pulse:    Resp:    SpO2:     Height: 5' 10.3" (178.6 cm) Weight: 280 lb (127 kg)  Physical Exam Constitutional:      Appearance: He is not diaphoretic.  HENT:     Head: Normocephalic.     Right Ear: Tympanic membrane, ear canal and external ear normal.     Left Ear: Tympanic membrane, ear canal and external ear normal.     Nose: Nose normal. No mucosal edema or rhinorrhea.     Mouth/Throat:     Pharynx: Uvula midline. No oropharyngeal exudate.  Eyes:  Conjunctiva/sclera: Conjunctivae normal.  Neck:     Thyroid: No thyromegaly.     Trachea: Trachea normal. No tracheal tenderness or tracheal deviation.  Cardiovascular:     Rate and Rhythm: Normal rate and regular rhythm.     Heart sounds: Normal heart sounds, S1 normal and S2 normal. No murmur heard. Pulmonary:     Effort: No respiratory distress.     Breath sounds: Normal breath sounds. No stridor. No wheezing or rales.  Lymphadenopathy:     Head:     Right side of head: No tonsillar adenopathy.     Left side of head: No tonsillar adenopathy.     Cervical: No cervical adenopathy.  Skin:    Findings: No erythema or rash.     Nails: There is no clubbing.  Neurological:     Mental Status: He is alert.    Diagnostics: Allergy skin tests were performed.  He demonstrated hypersensitivity to trees, grasses, weeds, molds, and dust mite.  Spirometry was performed and demonstrated an FEV1 of 3.12 @ 71 % of predicted. FEV1/FVC = 0.84.  Following the administration of nebulized albuterol his FEV1 increased to 3.25 which calculated out to an increase of 4%.  Assessment and Plan:    1. Not well controlled moderate persistent asthma   2. Perennial allergic rhinitis   3. Seasonal allergic rhinitis due to pollen   4. LPRD (laryngopharyngeal reflux disease)   5. Migraine  syndrome   6. Postprandial diarrhea   7. Primary hypertension     1.  Allergen avoidance measures - pollens, mold, dust mite  2.  Treat and prevent inflammation:  A. Montelukast 10 mg - 1 tablet 1 time per day B. AirDuo 113 - 1 inhalation 2 times per day (empty lungs) C. OTC Nasacort - 1 spray each nostril 1 time per day  3.  Treat and prevent reflux/LPR:  A. Omeprazole 40 mg - 1 tablet 2 times per day B. Slowly decrease caffeine consumption  4.  Treat and prevent headache:  A. Slowly decrease caffeine consumption  5.  Treat and prevent hypertension:  A. Aim for ideal body weight B. Engage in progressive aerobic exercise program  6.  If needed:  A. Albuterol HFA - 2 inhalations every 4-6 hours B. Loratadine 10 mg - 1 tablet 1-2 times per day  7.  Blood - alpha-gal panel, CBC w/D, CMP, Amylase  8.  Lactose intolerance?  9.  Further evaluation for GI issue?  10.  Return to clinic in 4 weeks or earlier if problem  Terry Todd has a atopic immune system contributing to inflammation of his airway and he also has these acute onset coughing/choking episodes which are probably tied up with his reflux disease and he also appears to have migraine headache and an issue with hypertension.  I have tried to address each of these issues during today's visit with the plan noted above which includes anti-inflammatory agents for his airway while he performs allergen avoidance measures as best as possible and treats his reflux a little more aggressively and also tapers off his caffeine to help both his reflux and his headaches.  As well, we did have a discussion today about his hypertension and some behavioral manipulation he can perform to get this under better control.  He has been having some significant abdominal issues recently and we will take a look at the possibility of alpha gal syndrome and just do a cursory screen of other organ function with the blood test noted  above.  There may be a  component of lactose intolerance that has developed and I talked to him about remaining away from lactose to determine if this is the case.  I will see him back in this clinic in 4 weeks or earlier if there is a problem.  Jessica PriestEric J. Layza Summa, MD Allergy / Immunology [facility]

## 2021-01-10 ENCOUNTER — Encounter: Payer: Self-pay | Admitting: Allergy and Immunology

## 2021-01-13 LAB — COMPREHENSIVE METABOLIC PANEL
ALT: 65 IU/L — ABNORMAL HIGH (ref 0–44)
AST: 32 IU/L (ref 0–40)
Albumin/Globulin Ratio: 1.8 (ref 1.2–2.2)
Albumin: 4.5 g/dL (ref 4.0–5.0)
Alkaline Phosphatase: 71 IU/L (ref 44–121)
BUN/Creatinine Ratio: 9 (ref 9–20)
BUN: 8 mg/dL (ref 6–20)
Bilirubin Total: 0.5 mg/dL (ref 0.0–1.2)
CO2: 24 mmol/L (ref 20–29)
Calcium: 9.8 mg/dL (ref 8.7–10.2)
Chloride: 106 mmol/L (ref 96–106)
Creatinine, Ser: 0.88 mg/dL (ref 0.76–1.27)
Globulin, Total: 2.5 g/dL (ref 1.5–4.5)
Glucose: 102 mg/dL — ABNORMAL HIGH (ref 65–99)
Potassium: 3.4 mmol/L — ABNORMAL LOW (ref 3.5–5.2)
Sodium: 142 mmol/L (ref 134–144)
Total Protein: 7 g/dL (ref 6.0–8.5)
eGFR: 117 mL/min/{1.73_m2} (ref >59)

## 2021-01-13 LAB — CBC WITH DIFFERENTIAL/PLATELET
Basophils Absolute: 0 10*3/uL (ref 0.0–0.2)
Basos: 0 %
EOS (ABSOLUTE): 0.2 10*3/uL (ref 0.0–0.4)
Eos: 2 %
Hematocrit: 41.9 % (ref 37.5–51.0)
Hemoglobin: 14 g/dL (ref 13.0–17.7)
Immature Grans (Abs): 0 10*3/uL (ref 0.0–0.1)
Immature Granulocytes: 0 %
Lymphocytes Absolute: 2.5 10*3/uL (ref 0.7–3.1)
Lymphs: 28 %
MCH: 29.6 pg (ref 26.6–33.0)
MCHC: 33.4 g/dL (ref 31.5–35.7)
MCV: 89 fL (ref 79–97)
Monocytes Absolute: 0.6 10*3/uL (ref 0.1–0.9)
Monocytes: 7 %
Neutrophils Absolute: 5.6 10*3/uL (ref 1.4–7.0)
Neutrophils: 63 %
Platelets: 319 10*3/uL (ref 150–450)
RBC: 4.73 x10E6/uL (ref 4.14–5.80)
RDW: 13.1 % (ref 11.6–15.4)
WBC: 8.9 10*3/uL (ref 3.4–10.8)

## 2021-01-13 LAB — ALPHA-GAL PANEL
Allergen Lamb IgE: <0.1 kU/L
Beef IgE: <0.1 kU/L
IgE (Immunoglobulin E), Serum: 75 IU/mL (ref 6–495)
O215-IgE Alpha-Gal: <0.1 kU/L
Pork IgE: <0.1 kU/L

## 2021-01-13 LAB — AMYLASE: Amylase: 51 U/L (ref 31–110)

## 2021-02-06 ENCOUNTER — Other Ambulatory Visit: Payer: Self-pay

## 2021-02-06 ENCOUNTER — Encounter: Payer: Self-pay | Admitting: Allergy and Immunology

## 2021-02-06 ENCOUNTER — Ambulatory Visit: Payer: 59 | Admitting: Allergy and Immunology

## 2021-02-06 VITALS — BP 140/88 | HR 89 | Resp 16

## 2021-02-06 DIAGNOSIS — J3089 Other allergic rhinitis: Secondary | ICD-10-CM | POA: Diagnosis not present

## 2021-02-06 DIAGNOSIS — J4540 Moderate persistent asthma, uncomplicated: Secondary | ICD-10-CM

## 2021-02-06 DIAGNOSIS — J301 Allergic rhinitis due to pollen: Secondary | ICD-10-CM

## 2021-02-06 DIAGNOSIS — K219 Gastro-esophageal reflux disease without esophagitis: Secondary | ICD-10-CM

## 2021-02-06 DIAGNOSIS — G43909 Migraine, unspecified, not intractable, without status migrainosus: Secondary | ICD-10-CM

## 2021-02-06 DIAGNOSIS — R7989 Other specified abnormal findings of blood chemistry: Secondary | ICD-10-CM

## 2021-02-06 DIAGNOSIS — R1011 Right upper quadrant pain: Secondary | ICD-10-CM

## 2021-02-06 NOTE — Patient Instructions (Addendum)
  1.  Allergen avoidance measures - pollens, mold, dust mite  2.  Continue to treat and prevent inflammation:  A. Montelukast 10 mg - 1 tablet 1 time per day B. AirDuo 113 - 1 inhalation 2 times per day (empty lungs) C. OTC Nasacort - 1 spray each nostril 1 time per day  3.  Continue to treat and prevent reflux/LPR:  A. Omeprazole 40 mg - 1 tablet 2 times per day B. Slowly decrease caffeine consumption  4.  Continue to treat and prevent headache:  A. Slowly decrease caffeine consumption  5.  Continue to treat and prevent hypertension:  A. Aim for ideal body weight B. Engage in progressive aerobic exercise program  6.  If needed:  A. Albuterol HFA - 2 inhalations every 4-6 hours B. Loratadine 10 mg - 1 tablet 1-2 times per day  7.  Evaluation with gastroenterologist for postprandial abdominal cramping and pain and elevated liver function tests.  8.  Consider a course of immunotherapy  9.  Obtain fall flu vaccine  10.  Return to clinic in November 2022 or earlier if problem

## 2021-02-06 NOTE — Progress Notes (Signed)
[place] - [place] - [place] - [place] - [place]   Follow-up Note  Referring Provider: Philemon Kingdom, MD Primary Provider: Philemon Kingdom, MD Date of Office Visit: 02/06/2021  Subjective:   Terry Todd (DOB: [birth year]) is a 35 y.o. male who [facility]21 in re-evaluation of the following:  HPI: Terry Todd returns to this clinic in evaluation of asthma and allergic rhinitis and LPR and migraine and postprandial diarrhea and a history of primary hypertension.  His last visit to this clinic was 09 January 2021.  He is better regarding his airway.  He has no issues with asthma and has very little issues with his nose although he still has some occasional nasal burning and congestion.  He has resolved his choking and coughing and throat clearing.  He is eliminated all caffeine consumption.  He has no need to use a short acting bronchodilator at this point  He still continues to have postprandial abdominal cramping and pain with a predilection for the right upper quadrant and some occasional diarrhea.  He did try a lactose-free diet which did not help this issue.  He is on a new blood pressure medicine from his primary care doctor and he is lost 4 pounds of weight.  Allergies as of 02/06/2021   No Known Allergies      Medication List    AirDuo Digihaler 113-14 MCG/ACT Aepb Generic drug: Fluticasone-Salmeterol(sensor) Inhale one puff twice daily to prevent cough or wheeze.  Rinse, gargle, and spit after use.   albuterol 108 (90 Base) MCG/ACT inhaler Commonly known as: VENTOLIN HFA Inhale 2 puffs into the lungs every 4 (four) hours as needed.   ibuprofen 600 MG tablet Commonly known as: ADVIL Take 600 mg by mouth as needed.   methocarbamol 500 MG tablet Commonly known as: ROBAXIN Take 500 mg by mouth 3 (three) times daily as needed.   montelukast 10 MG tablet Commonly known as: SINGULAIR Take 1 tablet (10 mg total) by  mouth daily.   omeprazole 40 MG capsule Commonly known as: PRILOSEC Take 40 mg by mouth daily.   spironolactone-hydrochlorothiazide 25-25 MG tablet Commonly known as: ALDACTAZIDE Take 0.5 tablets by mouth daily.   Vitamin D (Ergocalciferol) 1.25 MG (50000 UNIT) Caps capsule Commonly known as: DRISDOL Take 50,000 Units by mouth once a week.        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   HENT: Negative.    Eyes: Negative.   Respiratory: Negative.    Cardiovascular: Negative.   Gastrointestinal: Negative.   Genitourinary: Negative.   Musculoskeletal: Negative.   Skin: Negative.   Neurological: Negative.   Endo/Heme/Allergies: Negative.   Psychiatric/Behavioral: Negative.      Objective:  Vitals:   02/06/21 1522  BP: 140/88  Pulse: 89  Resp: 16  SpO2: 97%          Physical Exam Constitutional:      Appearance: He is not diaphoretic.  HENT:     Head: Normocephalic.     Right Ear: Tympanic membrane, ear canal and external ear normal.     Left Ear: Tympanic membrane, ear canal and external ear normal.     Nose: Nose normal. No mucosal edema or rhinorrhea.     Mouth/Throat:     Pharynx: Uvula midline. No oropharyngeal exudate.  Eyes:     Conjunctiva/sclera: Conjunctivae normal.  Neck:     Thyroid:  No thyromegaly.     Trachea: Trachea normal. No tracheal tenderness or tracheal deviation.  Cardiovascular:     Rate and Rhythm: Normal rate and regular rhythm.     Heart sounds: Normal heart sounds, S1 normal and S2 normal. No murmur heard. Pulmonary:     Effort: No respiratory distress.     Breath sounds: Normal breath sounds. No stridor. No wheezing or rales.  Lymphadenopathy:     Head:     Right side of head: No tonsillar adenopathy.     Left side of head: No tonsillar adenopathy.     Cervical: No cervical adenopathy.  Skin:    Findings: No erythema or rash.     Nails: There is no clubbing.  Neurological:     Mental Status: He is alert.    Diagnostics:    Spirometry was performed and demonstrated an FEV1 of 3.50 at 79 % of predicted.  Results of blood tests obtained 10 January 2021 identified WBC 8.9, absolute eosinophil 200, absolute basophil 0, absolute lymphocyte 2500, hemoglobin 14.0, platelet 319, negative alpha gal panel, creatinine 0.88 mg/DL, AST 97Q/B, ALT 34L/P, amylase 50 1U/L.  Assessment and Plan:   1. Asthma, moderate persistent, well-controlled   2. Perennial allergic rhinitis   3. Seasonal allergic rhinitis due to pollen   4. LPRD (laryngopharyngeal reflux disease)   5. Migraine syndrome   6. Postprandial abdominal pain in right upper quadrant   7. Elevated liver function tests     1.  Allergen avoidance measures - pollens, mold, dust mite  2.  Continue to treat and prevent inflammation:  A. Montelukast 10 mg - 1 tablet 1 time per day B. AirDuo 113 - 1 inhalation 2 times per day (empty lungs) C. OTC Nasacort - 1 spray each nostril 1 time per day  3.  Continue to treat and prevent reflux/LPR:  A. Omeprazole 40 mg - 1 tablet 2 times per day B. Slowly decrease caffeine consumption  4.  Continue to treat and prevent headache:  A. Slowly decrease caffeine consumption  5.  Continue to treat and prevent hypertension:  A. Aim for ideal body weight B.  Engage in progressive aerobic exercise program  6.  If needed:  A. Albuterol HFA - 2 inhalations every 4-6 hours B. Loratadine 10 mg - 1 tablet 1-2 times per day  7.  Evaluation with gastroenterologist for postprandial abdominal cramping and pain and elevated liver function tests.  8.  Consider a course of immunotherapy  9.  Obtain fall flu vaccine  10.  Return to clinic in November 2022 or earlier if problem  Wister is doing better with his airway issue while using a combination of anti-inflammatory agents for his airway and performing allergen avoidance measures as best as possible.  However, I do not know  if he will do this well as we go through our pollination seasons of the year.  He is definitely a candidate for immunotherapy and we had a discussion about starting this form of treatment during today's visit.  His reflux appears to be better as is his LPR on his current plan.  I would like for him to remain on this plan for least the next 8 weeks which would be a total of 12 weeks of therapy and then we will see if we can consolidate this treatment.  He still has an issue with postprandial pain with some predilection for the right upper quadrant and he has elevated liver function tests and we will send him to a gastroenterologist to address these issues.  I suspect his abnormal liver function tests are probably a reflection of fatty liver as he did have a CT scan of his chest at 1 point which did image his liver which identified fatty liver.  Laurette Schimke, MD Allergy / Immunology [facility]

## 2021-02-07 ENCOUNTER — Encounter: Payer: Self-pay | Admitting: Allergy and Immunology

## 2021-02-13 NOTE — Addendum Note (Signed)
Addended by: Deborra Medina on: 02/13/2021 05:13 PM   Modules accepted: Orders

## 2021-02-14 NOTE — Addendum Note (Signed)
Addended by: Deborra Medina on: 02/14/2021 10:20 AM   Modules accepted: Orders

## 2021-03-01 ENCOUNTER — Ambulatory Visit: Payer: 59 | Admitting: Gastroenterology

## 2021-03-01 ENCOUNTER — Other Ambulatory Visit (INDEPENDENT_AMBULATORY_CARE_PROVIDER_SITE_OTHER): Payer: 59

## 2021-03-01 ENCOUNTER — Encounter: Payer: Self-pay | Admitting: Gastroenterology

## 2021-03-01 ENCOUNTER — Other Ambulatory Visit: Payer: Self-pay

## 2021-03-01 VITALS — BP 120/60 | HR 78 | Ht 71.0 in | Wt 275.0 lb

## 2021-03-01 DIAGNOSIS — R195 Other fecal abnormalities: Secondary | ICD-10-CM

## 2021-03-01 DIAGNOSIS — R1030 Lower abdominal pain, unspecified: Secondary | ICD-10-CM

## 2021-03-01 DIAGNOSIS — R6881 Early satiety: Secondary | ICD-10-CM

## 2021-03-01 DIAGNOSIS — K219 Gastro-esophageal reflux disease without esophagitis: Secondary | ICD-10-CM

## 2021-03-01 DIAGNOSIS — R748 Abnormal levels of other serum enzymes: Secondary | ICD-10-CM

## 2021-03-01 DIAGNOSIS — K760 Fatty (change of) liver, not elsewhere classified: Secondary | ICD-10-CM

## 2021-03-01 DIAGNOSIS — R1310 Dysphagia, unspecified: Secondary | ICD-10-CM | POA: Diagnosis not present

## 2021-03-01 DIAGNOSIS — R1011 Right upper quadrant pain: Secondary | ICD-10-CM | POA: Diagnosis not present

## 2021-03-01 DIAGNOSIS — R140 Abdominal distension (gaseous): Secondary | ICD-10-CM

## 2021-03-01 LAB — IBC + FERRITIN
Ferritin: 107.9 ng/mL (ref 22.0–322.0)
Iron: 101 ug/dL (ref 42–165)
Saturation Ratios: 28.7 % (ref 20.0–50.0)
TIBC: 351.4 ug/dL (ref 250.0–450.0)
Transferrin: 251 mg/dL (ref 212.0–360.0)

## 2021-03-01 LAB — HEPATIC FUNCTION PANEL
ALT: 51 U/L (ref 0–53)
AST: 24 U/L (ref 0–37)
Albumin: 4.5 g/dL (ref 3.5–5.2)
Alkaline Phosphatase: 55 U/L (ref 39–117)
Bilirubin, Direct: 0.1 mg/dL (ref 0.0–0.3)
Total Bilirubin: 0.6 mg/dL (ref 0.2–1.2)
Total Protein: 7.5 g/dL (ref 6.0–8.3)

## 2021-03-01 MED ORDER — PANTOPRAZOLE SODIUM 40 MG PO TBEC: 40.0000 mg | DELAYED_RELEASE_TABLET | Freq: Two times a day (BID) | ORAL | 2 refills | Status: DC

## 2021-03-01 MED ORDER — PANTOPRAZOLE SODIUM 40 MG PO TBEC: 40.0000 mg | DELAYED_RELEASE_TABLET | Freq: Every day | ORAL | 1 refills | Status: DC

## 2021-03-01 NOTE — Progress Notes (Signed)
HPI :  35 year old male with a history of asthma, GERD, migraine headaches, elevated liver enzymes, referred here by Dr. Lerry Paterson, MD for abdominal pain, reflux, dysphagia, elevated liver enzymes.  He states he has had reflux symptoms for the past few years, has been on omeprazole 40 mg twice daily for at least 3 to 4 years that he can think of.  He does have baseline pyrosis and this has certainly helped but he still has breakthrough symptoms of reflux despite taking the medication.  He has a sense of globus as well and feels choked frequently when he eats.  He actually states this can happen more so with liquids but does endorse some dysphagia when eating his meals at times as well.  He denies any nausea or vomiting after he eats.  He does have some right upper quadrant pain that bothers him after he eats.  This is been going on for about a year or so.  Can last anywhere from minutes to a few hours and goes away.  He also has some early satiety that he is complained of recently.  He has a history of low back pain and Planter fasciitis for which he takes NSAIDs but has not been using those on a daily basis, perhaps once a week at most.  He states his bowel habits can vary.  Typically he will have more so looser stools, can be postprandial at times and associated with pain at times.  He thinks provide some relief.  No blood in his stools.  He has had this been ongoing for some time but perhaps worse since he had COVID this past February.  He has not been taking anything for his bowels.  He does think he has particular food triggers that can produce or make the symptoms worse.  He endorses having elevated liver enzymes for "a long time".  On review of his chart I see that he has had an elevated ALT ranging 46-65 over the past year.  He has had about 3 courses of steroids for asthma over the past year.  He reports intermittent steroid use in the past for asthma.  He is currently on a better regimen per  Dr. Carmelina Peal, is on Singulair and air duo inhaler and states his asthma is much better controlled at this time doing well in that regard.  His most recent set of LFTs were July 19 at which time his ALT was 65, AST was normal, alk phos was normal.  His mother had cirrhosis due to alcohol use.  He denies any other family history of liver disease.  He does not drink any alcohol, no tobacco.  He did have a CT scan of his chest on May 11 to evaluate his lungs, the upper portion of his liver was noted on this exam and had steatosis noted.  He does not have any form imaging of the liver on file seen today.  Discussed all his issues and management options.  He denies any family history of colon cancer or IBD.   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   Laryngopharyngeal reflux (LPR) 07/21/2019   Migraine    Migraines 01/04/2020   Migraines    Obesity (BMI 30-39.9) 01/05/2020   Palpitations 01/05/2020   Right-sided  chest wall pain    Sensation of lump in throat 03/27/2018   Shortness of breath 01/05/2020   Sinus arrhythmia 01/05/2020   Tachycardia      Past Surgical History:  Procedure Laterality Date   NO PAST SURGERIES     Family History  Problem Relation Age of Onset   Cirrhosis Mother    Hepatitis C Mother    High Cholesterol Father    Kidney Stones Father    Asthma Brother    Migraines Brother    Diabetes Mellitus II Paternal Grandmother    Diabetes Mellitus II Paternal Grandfather    Colon cancer Neg Hx    Esophageal cancer Neg Hx    Pancreatic cancer Neg Hx    Liver cancer Neg Hx    Stomach cancer Neg Hx    Social History   Tobacco Use   Smoking status: Never   Smokeless tobacco: Never  Vaping Use   Vaping Use: Never used  Substance Use Topics   Alcohol use: Never   Drug use:  Never   Current Outpatient Medications  Medication Sig Dispense Refill   AIRDUO DIGIHALER 113-14 MCG/ACT AEPB Inhale one puff twice daily to prevent cough or wheeze.  Rinse, gargle, and spit after use. 1 each 5   albuterol (VENTOLIN HFA) 108 (90 Base) MCG/ACT inhaler Inhale 2 puffs into the lungs every 4 (four) hours as needed.     ibuprofen (ADVIL) 600 MG tablet Take 600 mg by mouth as needed.     methocarbamol (ROBAXIN) 500 MG tablet Take 500 mg by mouth 3 (three) times daily as needed.     montelukast (SINGULAIR) 10 MG tablet Take 1 tablet (10 mg total) by mouth daily. 90 tablet 1   omeprazole (PRILOSEC) 40 MG capsule Take 40 mg by mouth daily.     spironolactone-hydrochlorothiazide (ALDACTAZIDE) 25-25 MG tablet Take 0.5 tablets by mouth daily.     Vitamin D, Ergocalciferol, (DRISDOL) 1.25 MG (50000 UNIT) CAPS capsule Take 50,000 Units by mouth once a week.     No current facility-administered medications for this visit.   No Known Allergies   Review of Systems: All systems reviewed and negative except where noted in HPI.    Lab Results  Component Value Date   ALT 65 (H) 01/10/2021   AST 32 01/10/2021   ALKPHOS 71 01/10/2021   BILITOT 0.5 01/10/2021    Lab Results  Component Value Date   CREATININE 0.88 01/10/2021   BUN 8 01/10/2021   NA 142 01/10/2021   K 3.4 (L) 01/10/2021   CL 106 01/10/2021   CO2 24 01/10/2021    Lab Results  Component Value Date   WBC 8.9 01/10/2021   HGB 14.0 01/10/2021   HCT 41.9 01/10/2021   MCV 89 01/10/2021   PLT 319 01/10/2021      Physical Exam: BP 120/60   Pulse 78   Ht _0  (1.803 m)   Wt 275 lb (124.7 kg)   SpO2 96%   BMI 38.35 kg/m  Constitutional: Pleasant,well-developed, male in no acute distress. HEENT: Normocephalic and atraumatic. Conjunctivae are normal. No scleral icterus. Neck supple.  Cardiovascular: Normal rate, regular rhythm.  Pulmonary/chest: Effort normal and breath sounds normal.  Abdominal: Soft,  nondistended, nontender. There are no masses palpable.  Extremities: no edema Lymphadenopathy: No cervical adenopathy noted. Neurological: Alert and oriented to person place and time. Skin: Skin is warm and dry. No rashes noted. Psychiatric: Normal mood and affect. Behavior is normal.  ASSESSMENT AND PLAN: 35 year old male here for new patient assessment the following  GERD Dysphagia Early satiety Right upper quadrant pain Lower abdominal pain Bloating Loose stools Elevated liver enzymes Fatty liver  Discussed all these issues as above.  Longstanding reflux which is better on PPI but not controlled.  He also has intermittent dysphagia and early satiety, with postprandial right upper quadrant pain.  He does have a history of asthma, needs to be ruled out for eosinophilic esophagitis which could cause this.  Recommending an upper endoscopy to further evaluate these persistent symptoms despite high-dose PPI.  I discussed risks and benefits of upper endoscopy and anesthesia, he wishes to proceed.  In the interim we discussed switching his omeprazole to a different PPI to see if he would get any additional benefit, he will stop omeprazole and we will start Protonix 40 mg twice daily for 1 month trial.  Regarding his bowel symptoms, possible he has IBS.  EGD will rule out celiac disease.  He has no alarm symptoms and blood work looks okay, no anemia. I discussed options for management of this with him and reviewed a low FODMAP diet, he will try this and see if it helps.  I offered him some Bentyl to use as needed in the interim, he is declining that for now and wishes to try dietary measures first.  For his right upper quadrant pain and his elevated liver enzymes I will order a right upper quadrant ultrasound to assess for gallstones, and assess his entire liver to make sure nothing else going on.  Suspect we will see steatosis as noted on the CT scan.  I discussed fatty liver with him, spectrum of  liver disease with this and risks for cirrhosis moving forward.  Ultimately recommend weight loss to treat this, he should avoid prednisone or systemic steroids if at all possible in this light.  I will otherwise check serologies for other chronic liver disease to make sure negative and check his immunity to hepatitis a and B and vaccinate if needed.  He agrees with the plan, further recommendations pending results.  Plan: - stop omeprazole - start protonix 36m BID - 1 month trial [facility]- trial of Low FODMAP diet, he declines trial of bentyl at this time - RUQ [place]- assess for fatty liver and gallstones - labs to evaluate for for chronic liver diseases  SJolly Mango MD [facility]  CC: PErnestene Kiel MD

## 2021-03-01 NOTE — Patient Instructions (Addendum)
If you are age 33 or older, your body mass index should be between 23-30. Your Body mass index is 38.35 kg/m. If this is out of the aforementioned range listed, please consider follow up with your Primary Care Provider.  If you are age 75 or younger, your body mass index should be between 19-25. Your Body mass index is 38.35 kg/m. If this is out of the aformentioned range listed, please consider follow up with your Primary Care Provider.   __________________________________________________________  The [facility] providers would like to encourage you to use [facility] to communicate with providers for non-urgent requests or questions.  Due to long hold times on the telephone, sending your provider a message by [facility] may be a faster and more efficient way to get a response.  Please allow 48 business hours for a response.  Please remember that this is for non-urgent requests.   You have been scheduled for an endoscopy. Please follow written instructions given to you at your visit today. If you use inhalers (even only as needed), please bring them with you on the day of your procedure.   Please go to the lab in the basement of our building to have lab work done as you leave today. Hit "B" for basement when you get on the elevator.  When the doors open the lab is on your left.  We will call you with the results. Thank you.  Discontinue omeprazole.   We have sent the following medications to your pharmacy for you to pick up at your convenience: Protonix 40 mg: Take twice daily  We are giving you a Low-FODMAP diet handout today. FODMAPs are short-chain carbohydrates (sugars) that are highly fermentable, which means that they go through chemical changes in the GI system, and are poorly absorbed during digestion. When FODMAPs reach the colon (large intestine), bacteria ferment these sugars, turning them into gas and chemicals. This stretches the walls of the colon, causing abdominal bloating, distension,  cramping, pain, and/or changes in bowel habits in many patients with IBS. FODMAPs are not unhealthy or harmful, but may exacerbate GI symptoms in those with sensitive GI tracts.  You will be contacted by [facility] Scheduling in the next 2 days to arrange a RUQ ultrasound.  The number on your caller ID will be [phone], please answer when they call.  If you have not heard from them in 2 days please call [phone] to schedule.     Thank you for entrusting me with your care and for choosing [facility], Dr. Ileene Patrick

## 2021-03-03 LAB — ALPHA-1-ANTITRYPSIN: A-1 Antitrypsin, Ser: 139 mg/dL (ref 83–199)

## 2021-03-03 LAB — IGG: IgG (Immunoglobin G), Serum: 1143 mg/dL (ref 600–1640)

## 2021-03-03 LAB — HEPATITIS B SURFACE ANTIBODY,QUALITATIVE: Hep B S Ab: NONREACTIVE

## 2021-03-03 LAB — ANA: Anti Nuclear Antibody (ANA): NEGATIVE

## 2021-03-03 LAB — CERULOPLASMIN: Ceruloplasmin: 25 mg/dL (ref 18–36)

## 2021-03-03 LAB — HEPATITIS B SURFACE ANTIGEN: Hepatitis B Surface Ag: NONREACTIVE

## 2021-03-03 LAB — ANTI-SMOOTH MUSCLE ANTIBODY, IGG: Actin (Smooth Muscle) Antibody (IGG): <20 U (ref <20)

## 2021-03-03 LAB — HEPATITIS A ANTIBODY, TOTAL: Hepatitis A AB,Total: NONREACTIVE

## 2021-03-03 LAB — HEPATITIS C ANTIBODY
Hepatitis C Ab: NONREACTIVE
SIGNAL TO CUT-OFF: 0.01 (ref <1.00)

## 2021-03-15 ENCOUNTER — Encounter: Payer: Self-pay | Admitting: Gastroenterology

## 2021-03-16 ENCOUNTER — Ambulatory Visit (HOSPITAL_COMMUNITY)
Admission: RE | Admit: 2021-03-16 | Discharge: 2021-03-16 | Disposition: A | Discharge status: Home / Self Care | Payer: 59 | Source: Ambulatory Visit | Attending: Gastroenterology | Admitting: Gastroenterology

## 2021-03-16 DIAGNOSIS — R1310 Dysphagia, unspecified: Secondary | ICD-10-CM | POA: Diagnosis present

## 2021-03-16 DIAGNOSIS — R1030 Lower abdominal pain, unspecified: Secondary | ICD-10-CM

## 2021-03-16 DIAGNOSIS — R748 Abnormal levels of other serum enzymes: Secondary | ICD-10-CM | POA: Diagnosis present

## 2021-03-16 DIAGNOSIS — R1011 Right upper quadrant pain: Secondary | ICD-10-CM

## 2021-03-16 DIAGNOSIS — R140 Abdominal distension (gaseous): Secondary | ICD-10-CM | POA: Diagnosis present

## 2021-03-16 DIAGNOSIS — K219 Gastro-esophageal reflux disease without esophagitis: Secondary | ICD-10-CM | POA: Diagnosis present

## 2021-03-16 DIAGNOSIS — R6881 Early satiety: Secondary | ICD-10-CM | POA: Diagnosis present

## 2021-03-16 DIAGNOSIS — R195 Other fecal abnormalities: Secondary | ICD-10-CM

## 2021-03-16 IMAGING — US US ABDOMEN LIMITED
1 series · 15 of 25 positions shown · non-contrast
Comparison: Right upper quadrant ultrasound [DATE]

CLINICAL DATA: Gastroesophageal reflux. Dysphagia. Early satiety.
Lower abdominal pain. Bloating. Loose stools. Elevated liver
enzymes.

EXAM:
ULTRASOUND ABDOMEN LIMITED RIGHT UPPER QUADRANT

[Series 1: us abdomen limited ruq mc & wl · 15 of 44 slices shown]
[im 1/44]
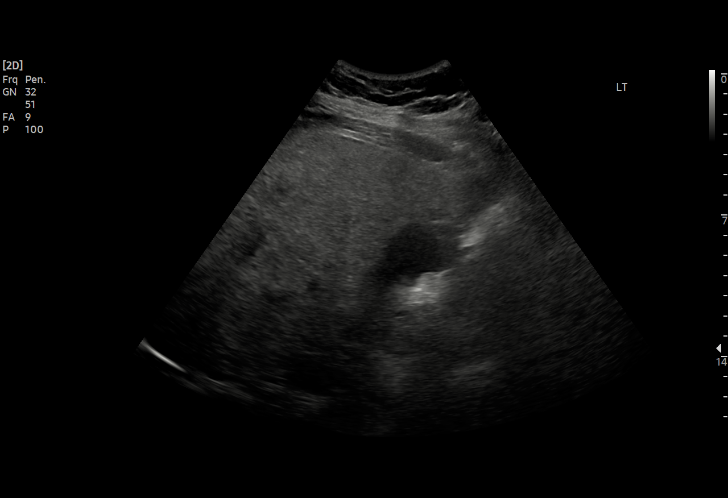
[im 4/44]
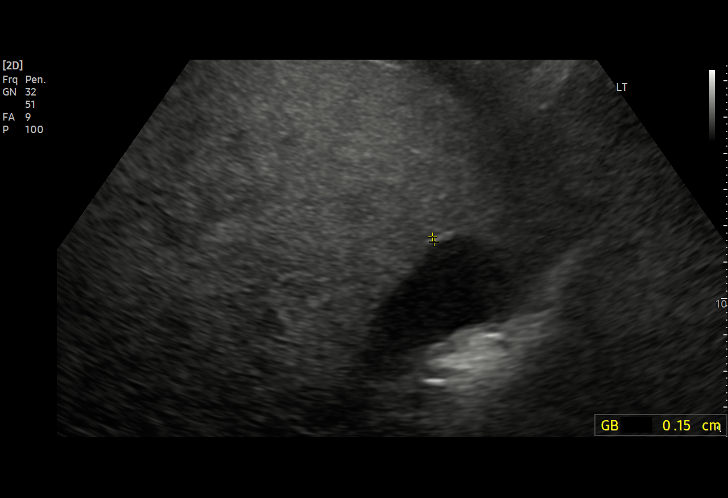
[im 8/44]
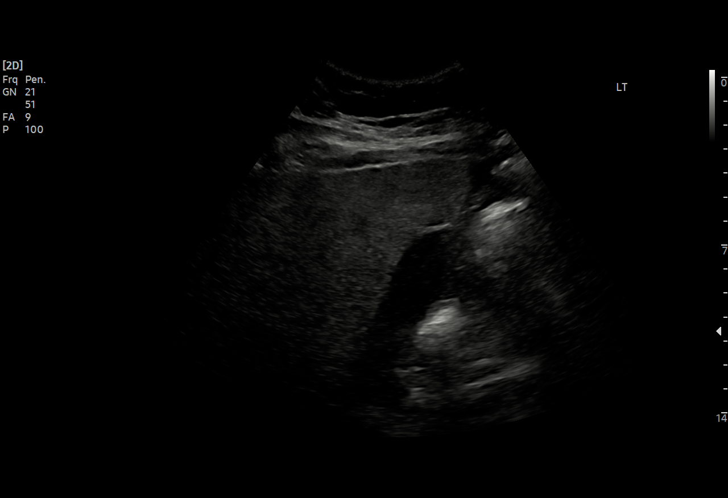
[im 9/44]
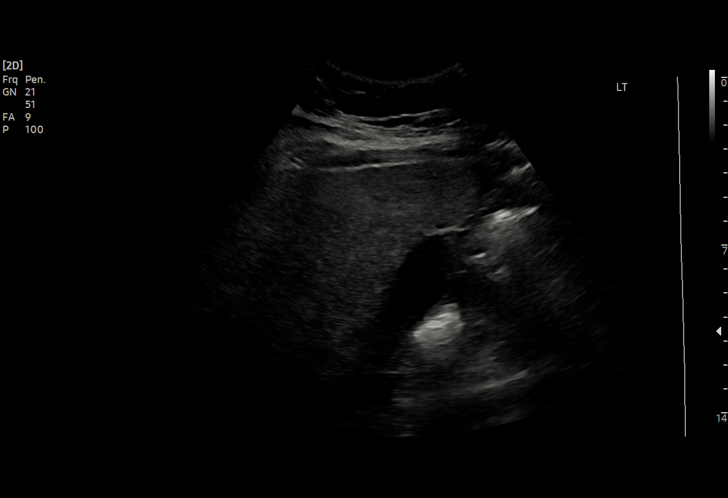
[im 13/44]
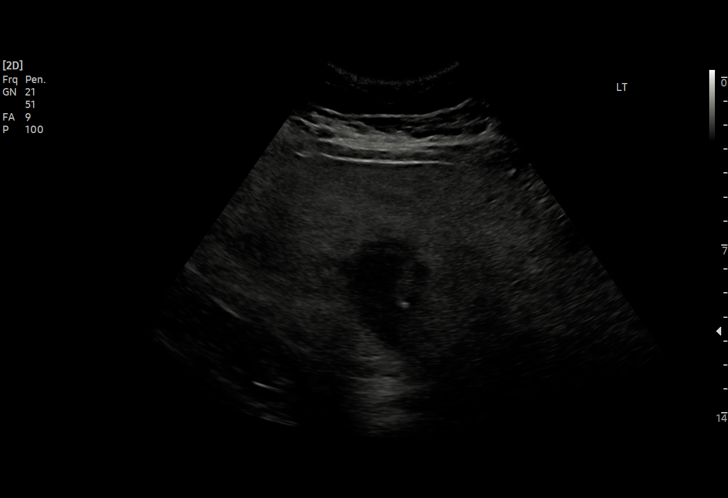
[im 17/44]
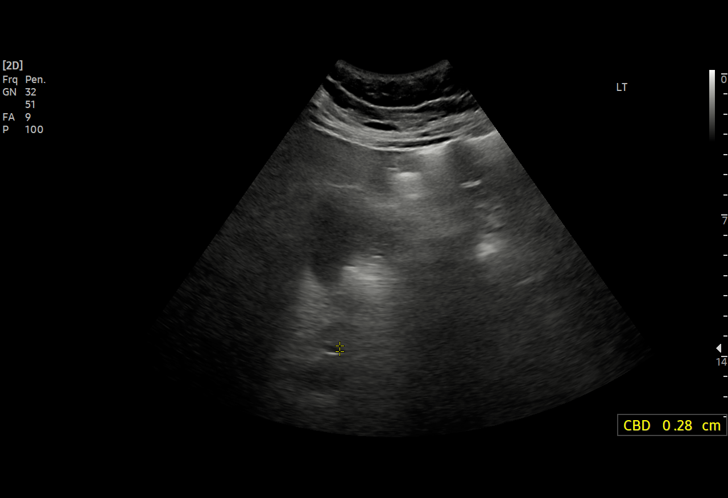
[im 18/44]
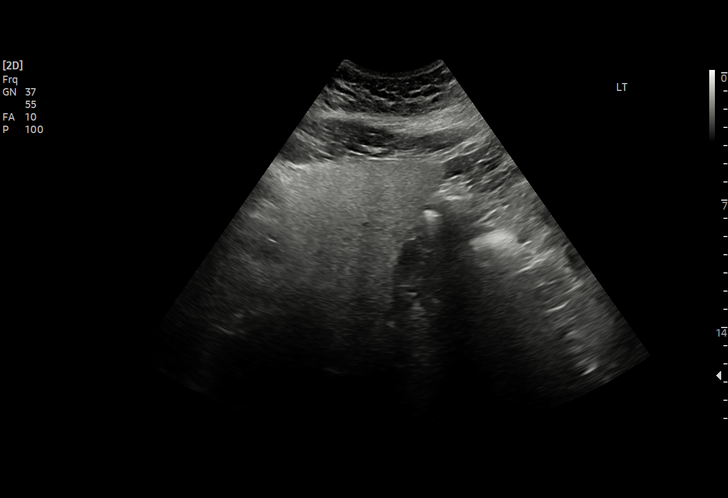
[im 22/44]
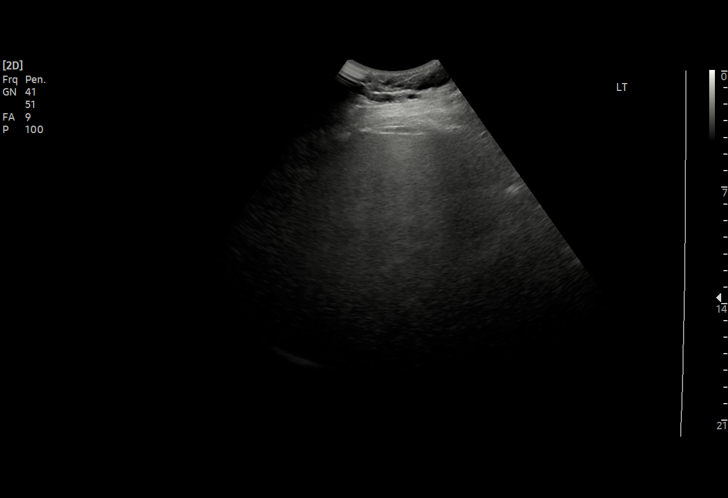
[im 26/44]
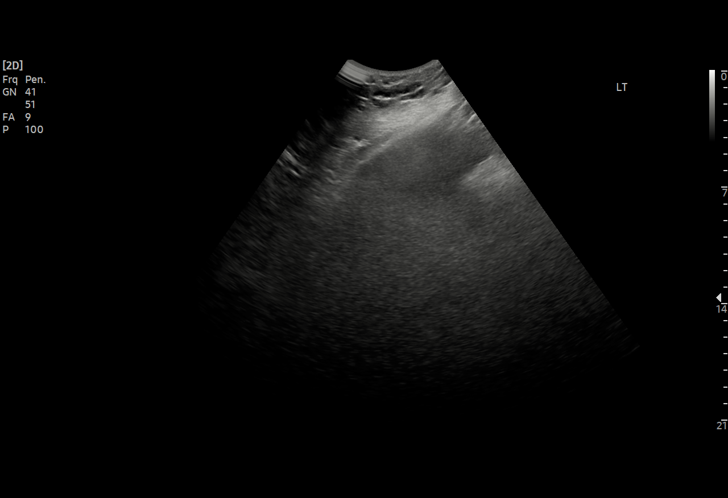
[im 27/44]
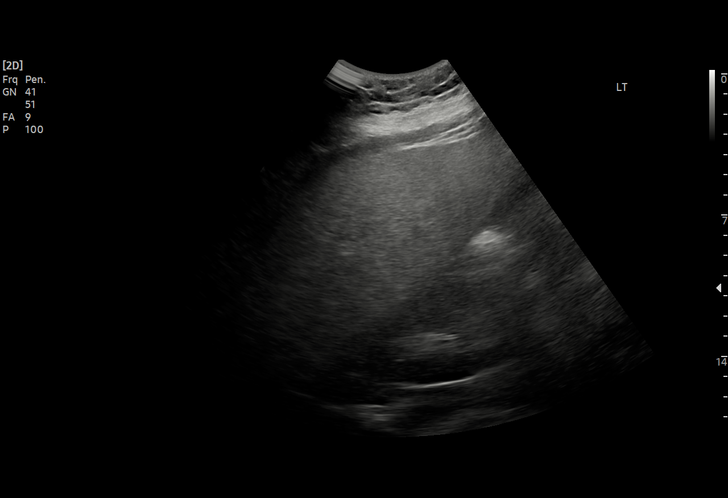
[im 31/44]
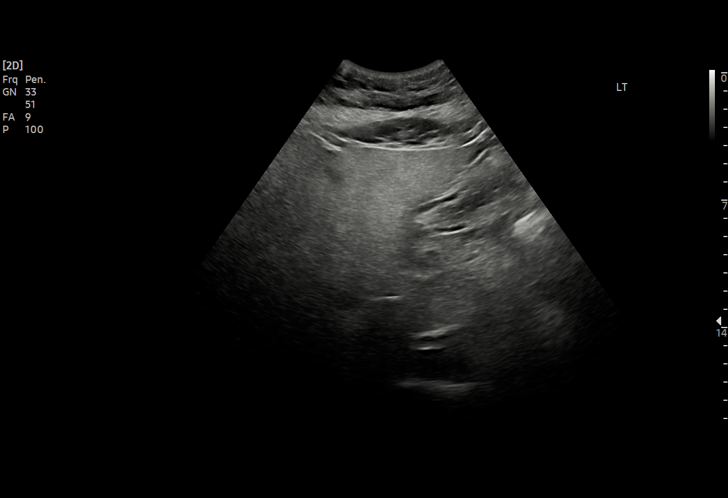
[im 35/44]
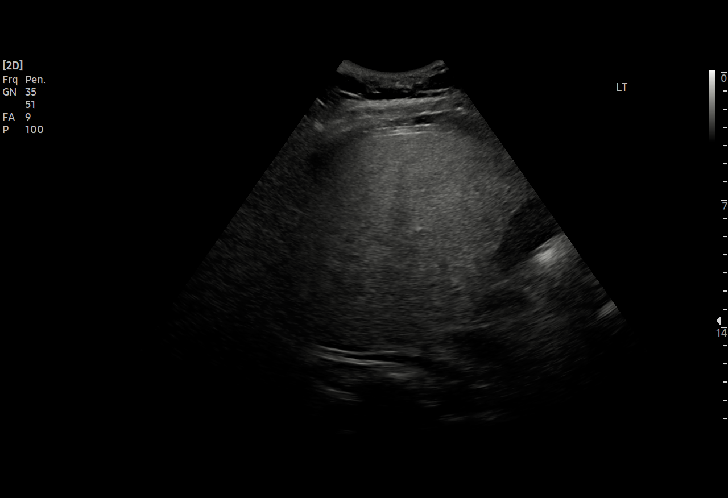
[im 36/44]
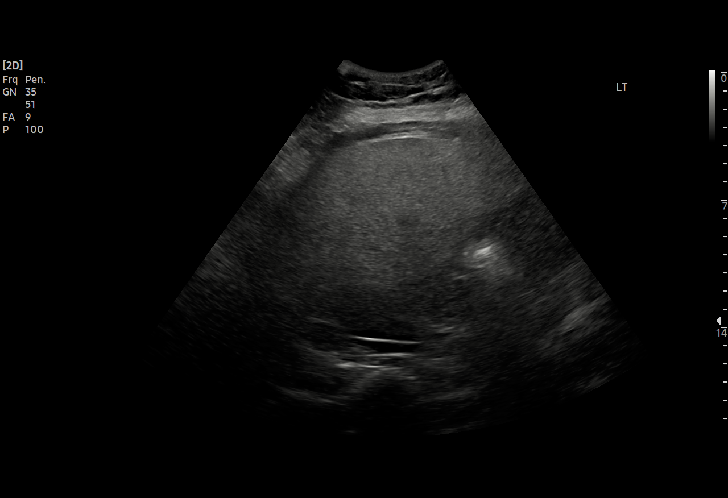
[im 40/44]
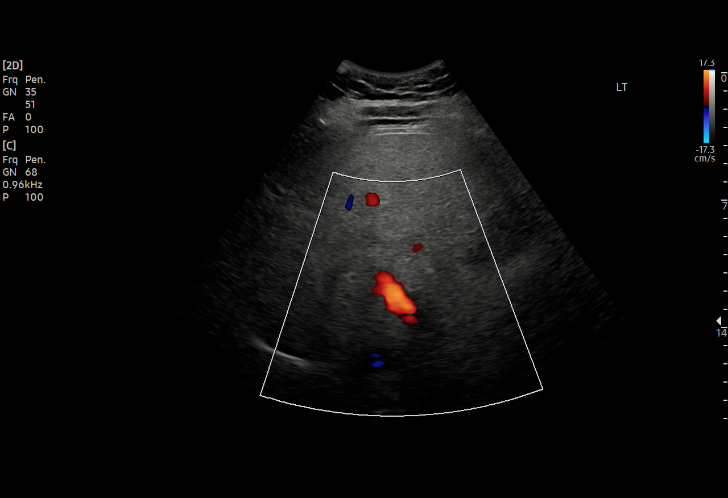
[im 44/44]
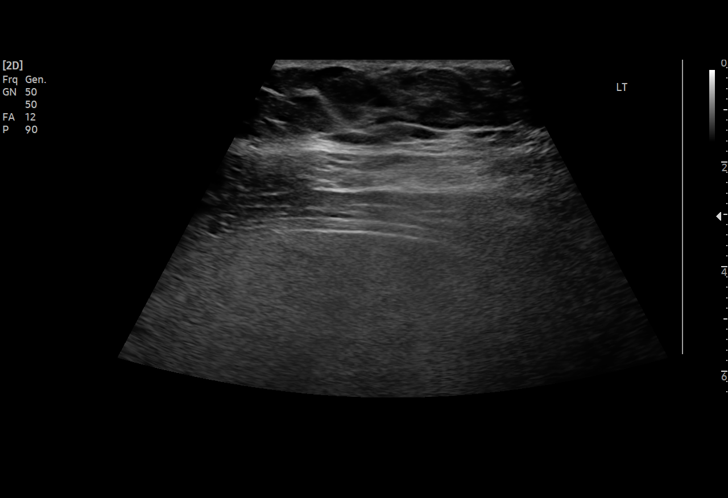

[15 of 25 positions shown; findings below may reference images not displayed]

FINDINGS: Gallbladder:

Physiologically distended. No gallstones or wall thickening
visualized. No sonographic Murphy sign noted by sonographer.

Common bile duct:

Diameter: 3 mm.

Liver:

Heterogeneously increased parenchymal echogenicity. No focal lesion.
No capsular nodularity. Portal vein is patent on color Doppler
imaging with normal direction of blood flow towards the liver.

Other: No right upper quadrant ascites.
IMPRESSION: 1. Diffuse hepatic steatosis without evidence of focal lesion.
2. No gallstones. Normal sonographic appearance of the gallbladder
and biliary tree.

## 2021-03-23 ENCOUNTER — Ambulatory Visit (AMBULATORY_SURGERY_CENTER): Payer: 59 | Admitting: Gastroenterology

## 2021-03-23 ENCOUNTER — Other Ambulatory Visit: Payer: Self-pay

## 2021-03-23 ENCOUNTER — Encounter: Payer: Self-pay | Admitting: Gastroenterology

## 2021-03-23 VITALS — BP 148/88 | HR 66 | Temp 97.5°F | Resp 17 | Ht 71.0 in | Wt 275.0 lb

## 2021-03-23 DIAGNOSIS — K219 Gastro-esophageal reflux disease without esophagitis: Secondary | ICD-10-CM

## 2021-03-23 DIAGNOSIS — R1310 Dysphagia, unspecified: Secondary | ICD-10-CM | POA: Diagnosis not present

## 2021-03-23 DIAGNOSIS — R195 Other fecal abnormalities: Secondary | ICD-10-CM

## 2021-03-23 DIAGNOSIS — R109 Unspecified abdominal pain: Secondary | ICD-10-CM | POA: Diagnosis not present

## 2021-03-23 MED ORDER — SODIUM CHLORIDE 0.9 % IV SOLN: 500.0000 mL | Freq: Once | INTRAVENOUS | Status: DC

## 2021-03-23 NOTE — Progress Notes (Signed)
VS by CW  I have reviewed the patient's medical history in detail and updated the computerized patient record.  

## 2021-03-23 NOTE — Progress Notes (Signed)
A and O x3. Report to RN. Tolerated MAC anesthesia well.Teeth unchanged after procedure. 

## 2021-03-23 NOTE — Progress Notes (Signed)
Called to room to assist during endoscopic procedure.  Patient ID and intended procedure confirmed with present staff. Received instructions for my participation in the procedure from the performing physician.  

## 2021-03-23 NOTE — Patient Instructions (Signed)
Please read handouts provided. Continue present medications. Await pathology results. Continue taking protonix.   YOU HAD AN ENDOSCOPIC PROCEDURE TODAY AT THE [facility]:   Refer to the procedure report that was given to you for any specific questions about what was found during the examination.  If the procedure report does not answer your questions, please call your gastroenterologist to clarify.  If you requested that your care partner not be given the details of your procedure findings, then the procedure report has been included in a sealed envelope for you to review at your convenience later.  YOU SHOULD EXPECT: Some feelings of bloating in the abdomen. Passage of more gas than usual.  Walking can help get rid of the air that was put into your GI tract during the procedure and reduce the bloating. If you had a lower endoscopy (such as a colonoscopy or flexible sigmoidoscopy) you may notice spotting of blood in your stool or on the toilet paper. If you underwent a bowel prep for your procedure, you may not have a normal bowel movement for a few days.  Please Note:  You might notice some irritation and congestion in your nose or some drainage.  This is from the oxygen used during your procedure.  There is no need for concern and it should clear up in a day or so.  SYMPTOMS TO REPORT IMMEDIATELY:    Following upper endoscopy (EGD)  Vomiting of blood or coffee ground material  New chest pain or pain under the shoulder blades  Painful or persistently difficult swallowing  New shortness of breath  Fever of 100F or higher  Black, tarry-looking stools  For urgent or emergent issues, a gastroenterologist can be reached at any hour by calling [phone]. Do not use MyChart messaging for urgent concerns.    DIET:  We do recommend a small meal at first, but then you may proceed to your regular diet.  Drink plenty of fluids but you should avoid alcoholic beverages for 24  hours.  ACTIVITY:  You should plan to take it easy for the rest of today and you should NOT DRIVE or use heavy machinery until tomorrow (because of the sedation medicines used during the test).    FOLLOW UP: Our staff will call the number listed on your records 48-72 hours following your procedure to check on you and address any questions or concerns that you may have regarding the information given to you following your procedure. If we do not reach you, we will leave a message.  We will attempt to reach you two times.  During this call, we will ask if you have developed any symptoms of COVID 19. If you develop any symptoms (ie: fever, flu-like symptoms, shortness of breath, cough etc.) before then, please call [phone].  If you test positive for Covid 19 in the 2 weeks post procedure, please call and report this information to [place].    If any biopsies were taken you will be contacted by phone or by letter within the next 1-3 weeks.  Please call us at [phone] if you have not heard about the biopsies in 3 weeks.    SIGNATURES/CONFIDENTIALITY: You and/or your care partner have signed paperwork which will be entered into your electronic medical record.  These signatures attest to the fact that that the information above on your After Visit Summary has been reviewed and is understood.  Full responsibility of the confidentiality of this discharge information lies with you and/or  your care-partner.

## 2021-03-23 NOTE — Progress Notes (Signed)
History and Physical Interval Note: See clinic note for details of his case from 03/01/21. No interval changes. On protonix and feeling somewhat better than when on omeprazole. Here for EGD And possible dilation. He agrees to proceed.  03/23/2021 10:39 AM  Terry Todd  has presented today for endoscopic procedure(s), with the diagnosis of  Encounter Diagnoses  Name Primary?   Gastroesophageal reflux disease, unspecified whether esophagitis present Yes   Dysphagia, unspecified type    Abdominal pain, unspecified abdominal location    Loose stools   .  The various methods of evaluation and treatment have been discussed with the patient and/or family. After consideration of risks, benefits and other options for treatment, the patient has consented to  the endoscopic procedure(s).   The patient's history has been reviewed, patient examined, no change in status, stable for surgery.  I have reviewed the patient's chart and labs.  Questions were answered to the patient's satisfaction.    Harlin Rain, MD [facility]

## 2021-03-23 NOTE — Op Note (Signed)
[facility] Patient Name: Terry Todd Procedure Date: 03/23/2021 10:37 AM MRN: [id number] Endoscopist: Viviann Spare P. Adela Lank , MD Age: 35 Referring MD:  Date of Birth: 1986-01-26 Gender: Male Account #: [id number] Procedure:                Upper GI endoscopy Indications:              Upper postprandial abdominal pain, history of                            dysphagia, history of gastro-esophageal reflux                            disease with breakthrough on omeprazole - switched                            to protonix with some improvement in symptoms,                            loose stools. History of asthma, rule out EoE Medicines:                Monitored Anesthesia Care Procedure:                Pre-Anesthesia Assessment:                           - Prior to the procedure, a History and Physical                            was performed, and patient medications and                            allergies were reviewed. The patient's tolerance of                            previous anesthesia was also reviewed. The risks                            and benefits of the procedure and the sedation                            options and risks were discussed with the patient.                            All questions were answered, and informed consent                            was obtained. Prior Anticoagulants: The patient has                            taken no previous anticoagulant or antiplatelet                            agents. ASA Grade Assessment: II - A patient with  mild systemic disease. After reviewing the risks                            and benefits, the patient was deemed in                            satisfactory condition to undergo the procedure.                           After obtaining informed consent, the endoscope was                            passed under direct vision. Throughout the                            procedure, the  patient's blood pressure, pulse, and                            oxygen saturations were monitored continuously. The                            Endoscope was introduced through the mouth, and                            advanced to the second part of duodenum. The upper                            GI endoscopy was accomplished without difficulty.                            The patient tolerated the procedure well. Scope In: Scope Out: Findings:                 Esophagogastric landmarks were identified: the                            Z-line was found at 42 cm, the gastroesophageal                            junction was found at 42 cm and the upper extent of                            the gastric folds was found at 42 cm from the                            incisors.                           The exam of the esophagus was otherwise normal. No                            focal stenosis / stricture otherwise.  Biopsies were taken with a cold forceps in the                            upper third of the esophagus, in the middle third                            of the esophagus and in the lower third of the                            esophagus for histology to rule out eosinophilic                            esophagitis.                           The entire examined stomach was normal. Biopsies                            were taken with a cold forceps for Helicobacter                            pylori testing.                           The duodenal bulb and second portion of the                            duodenum were normal. Biopsies for histology were                            taken with a cold forceps for evaluation of celiac                            disease. Complications:            No immediate complications. Estimated blood loss:                            Minimal. Estimated Blood Loss:     Estimated blood loss was minimal. Impression:               -  Esophagogastric landmarks identified.                           - Normal esophagus otherwise without overt                            inflammatory changes or stenosis / stricture -                            biopsies obtained.                           - Normal stomach. Biopsied.                           -  Normal duodenal bulb and second portion of the                            duodenum. Biopsied. Recommendation:           - Patient has a contact number available for                            emergencies. The signs and symptoms of potential                            delayed complications were discussed with the                            patient. Return to normal activities tomorrow.                            Written discharge instructions were provided to the                            patient.                           - Resume previous diet.                           - Continue present medications including protonix                            if that has helped so far                           - Await pathology results with further                            recommendations Viviann Spare P. Adela Lank, MD 03/23/2021 10:59:29 AM This report has been signed electronically.

## 2021-03-25 ENCOUNTER — Telehealth: Payer: Self-pay | Admitting: Gastroenterology

## 2021-03-25 NOTE — Telephone Encounter (Signed)
Mr. Lupinacci called the service line this evening because of persistent symptoms of abdominal pain, loose stools, excessive urination and wheezing since his upper endoscopy on Sept 29th.  The EGD was unremarkable and biopsies were taken of the esophagus, stomach and duodenum.   He has had 2-3 loose stools per day which is a change for him.  He has had wheezing but denied shortness of breath, fevers/chills, productive cough.   I told him it was possible that he could have had some microaspiration that occurred during the procedure that was exacerbating his asthma and I recommended he try using his albuterol inhaler to see if this helps the wheezing.  I recommended he give the loose stools a little more time, as he did not seem to have symptoms suggestive of an acute infectious enteritis, and I felt his loose stools were likely to be attributable to his suspected IBS.  I told him that someone from Dr. Lanetta Inch team would reach out to him Monday to see how his symptoms are doing.   I advised him to go the ER if he developed shortness of breath or fevers/chills

## 2021-03-27 ENCOUNTER — Telehealth: Payer: Self-pay | Admitting: *Deleted

## 2021-03-27 ENCOUNTER — Telehealth: Payer: Self-pay

## 2021-03-27 NOTE — Telephone Encounter (Signed)
Thanks [name], Terry Todd can you check up on him sometime today and see how he is doing? Thanks

## 2021-03-27 NOTE — Telephone Encounter (Signed)
Thanks for the update [place], sorry hear about this. I do not think the urination issue is related to his endoscopy at all, he needs to contact his primary care about that as well as the wheezing.  He has a history of asthma, not sure if that is flaring or not and causing these symptoms.  There was no overt aspiration during his procedure, that would seem a bit less likely. If he develops fever or if coughing / short of breath he should contact us.  Hopefully this will improve with a little bit more time.  He should continue Protonix twice daily.  He has had these altered bowel habits in the past which we discussed in the office.  I have previously recommended a trial of a low FODMAP diet, he declined a trial of Bentyl in the past which I offered to him.  I am waiting the biopsies of his procedure and will relay those to him when to return with further recommendations.  His ultrasound showed no evidence of gallstones.  Thanks

## 2021-03-27 NOTE — Telephone Encounter (Signed)
First attempt follow up call to pt, lm for pt to call if having any problems or questions, otherwise we will call them back later this morning or early this afternoon.  °

## 2021-03-27 NOTE — Telephone Encounter (Signed)
Inbound call from pt requesting a call back stating that he is experiencing mild chest pain, wheezing, abd pain, frequent urination. Change is bm. Please advise. Thank you.

## 2021-03-27 NOTE — Telephone Encounter (Signed)
Second follow up call made. 

## 2021-03-27 NOTE — Telephone Encounter (Signed)
Spoke with patient, he states that he is still having symptoms although some have improved. Pt states that the frequent urination has improved some and he is not going as often. He reports that he has still been wheezing and his chest is sore. Pt reports a hx of Asthma and pneumonia. He states that he has been taking his inhaler BID which helps with the wheezing but it has not completely resolved. Pt reports pain in the middle of his ribcage. He denies any SOB or coughing. Pt is still taking Protonix 40 mg BID. Pt reports that he has lower abdominal pain as well, he describes as a crampy/sharp pain. Not relieved by a BM. His last BM was yesterday and he described as a loose stool, only one episode. Pt reports that he has felt like he needed to have a BM but was not able to due so. Pt denies a fever. Please advise, thanks.

## 2021-03-27 NOTE — Telephone Encounter (Signed)
Spoke with patient in regards to recommendations. Pt would like to try low FODMAP diet prior to Bentyl. Advised patient that I will send him a low FODMAP diet to his my chart, he confirmed that he does have access. Pt verbalized understanding of all information and had no concerns at the end of the call.

## 2021-03-28 NOTE — Telephone Encounter (Signed)
Call to Pt states he called the office and pt states he talked with a nurse and Dr Adela Lank and he is feeling better today.

## 2021-05-11 ENCOUNTER — Ambulatory Visit: Payer: 59 | Admitting: Allergy and Immunology

## 2021-05-24 ENCOUNTER — Ambulatory Visit: Payer: 59 | Admitting: Allergy and Immunology

## 2021-08-23 NOTE — Progress Notes (Signed)
? ?Synopsis: Referred for moderate persistent asthma by Philemon Kingdom, MD ? ?Subjective:  ? ?PATIENT ID: Terry Todd GENDER: male DOB: April 10, 1986, MRN: [id number] ? ?Chief Complaint  ?Patient presents with  ? Pulmonary Consult  ?  Referred by Dr Sudie Bailey.  Pt states dx with Asthma as a child. He c/o worsening SOB, cough and wheezing since had Covid 19 Feb and Dec 2022. Cough is non prod. He sometimes wakes up in the night with coughing spell.  He is using his albuterol once per wk on average.   ? ?35yM with history of asthma, AR, LPR followed by Dr. Lucie Leather on singulair - no longer taking, airduo 113 1 puff BID, nasacort - daily use, protonix 40 BID. He has only ever used airduo for controller inhaler.  ? ?Had covid 07/2019 and then again in 05/2021. He is vaccinated. First course wasn't too bad. Second course went to ED. Just longstanding fever, felt like the flu.  ? ?Since second course of covid feel greater dyspnea, more fatigued overall. Over last couple of weeks as allergy season has kicked into gear has more sinonasal congestion, feels more allergy. When he was cooking recently he had episode where he felt suddenly dyspneic, with choking sensation. During night has coughing fits as well that wake him up. Wakes once weekly with these episodes. ? ?He has seen ENT - saw Dr. Jenne Pane with [facility].  ? ?Otherwise pertinent review of systems is negative. ? ?He has no family history of lung disease ? ?He works as a Education officer, environmental. He has never lived outside of [place]. Never smoker, vaping, MJ. He has a dog, cat.  ? ?Past Medical History:  ?Diagnosis Date  ? Adjustment insomnia   ? Asthma   ? BMI 36.0-36.9,adult   ? Elevated transaminase level   ? GERD without esophagitis   ? 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 Migraine   ? Migraines 01/04/2020  ? Migraines   ? Obesity (BMI 30-39.9) 01/05/2020  ? Palpitations 01/05/2020  ? Right-sided chest wall pain   ? Sensation of lump in  throat 03/27/2018  ? Shortness of breath 01/05/2020  ? Sinus arrhythmia 01/05/2020  ? Tachycardia   ?  ? ?Family History  ?Problem Relation Age of Onset  ? Cirrhosis Mother   ? Hepatitis C Mother   ? High Cholesterol Father   ? Kidney Stones Father   ? Allergies Father   ? Asthma Father   ? Asthma Brother   ? Migraines Brother   ? Breast cancer Maternal Grandmother   ? Diabetes Mellitus II Paternal Grandmother   ? Diabetes Mellitus II Paternal Grandfather   ? Prostate cancer Paternal Grandfather   ? Colon cancer Neg Hx   ? Esophageal cancer Neg Hx   ? Pancreatic cancer Neg Hx   ? Liver cancer Neg Hx   ? Stomach cancer Neg Hx   ?  ? ?Past Surgical History:  ?Procedure Laterality Date  ? NO PAST SURGERIES    ? ? ?Social History  ? ?Socioeconomic History  ? Marital status: Single  ?  Spouse name: Not on file  ? Number of children: Not on file  ? Years of education: Not on file  ? Highest education level: Not on file  ?Occupational History  ? Not on file  ?Tobacco Use  ? Smoking status: Never  ? Smokeless tobacco: Never  ?Vaping Use  ? Vaping Use: Never used  ?Substance and Sexual Activity  ?  Alcohol use: Never  ? Drug use: Never  ? Sexual activity: Not on file  ?Other Topics Concern  ? Not on file  ?Social History Narrative  ? Not on file  ? ?Social Determinants of Health  ? ?Financial Resource Strain: Not on file  ?Food Insecurity: Not on file  ?Transportation Needs: Not on file  ?Physical Activity: Not on file  ?Stress: Not on file  ?Social Connections: Not on file  ?Intimate Partner Violence: Not on file  ?  ? ?No Known Allergies  ? ?Outpatient Medications Prior to Visit  ?Medication Sig Dispense Refill  ? AIRDUO DIGIHALER 113-14 MCG/ACT AEPB Inhale one puff twice daily to prevent cough or wheeze.  Rinse, gargle, and spit after use. 1 each 5  ? albuterol (VENTOLIN HFA) 108 (90 Base) MCG/ACT inhaler Inhale 2 puffs into the lungs every 4 (four) hours as needed.    ? ibuprofen (ADVIL) 600 MG tablet Take 600 mg by mouth  as needed.    ? pantoprazole (PROTONIX) 40 MG tablet Take 1 tablet (40 mg total) by mouth 2 (two) times daily. 60 tablet 2  ? Vitamin D, Ergocalciferol, (DRISDOL) 1.25 MG (50000 UNIT) CAPS capsule Take 50,000 Units by mouth once a week.    ? methocarbamol (ROBAXIN) 500 MG tablet Take 500 mg by mouth 3 (three) times daily as needed.    ? montelukast (SINGULAIR) 10 MG tablet Take 1 tablet (10 mg total) by mouth daily. 90 tablet 1  ? spironolactone-hydrochlorothiazide (ALDACTAZIDE) 25-25 MG tablet Take 0.5 tablets by mouth daily.    ? ?No facility-administered medications prior to visit.  ? ? ? ? ? ?Objective:  ? ?Physical Exam: ? ?General appearance: 36 y.o., male, NAD, conversant  ?Eyes: anicteric sclerae; PERRL, tracking appropriately ?HENT: NCAT; MMM ?Neck: Trachea midline; no lymphadenopathy, no JVD ?Lungs: CTAB, no crackles, no wheeze, with normal respiratory effort ?CV: RRR, no murmur  ?Abdomen: Soft, non-tender; non-distended, BS present  ?Extremities: No peripheral edema, warm ?Skin: Normal turgor and texture; no rash ?Psych: Appropriate affect ?Neuro: Alert and oriented to person and place, no focal deficit  ? ? ? ?Vitals:  ? 08/24/21 1031  ?BP: 126/82  ?Pulse: 82  ?Temp: 98.9 ?F (37.2 ?C)  ?TempSrc: Oral  ?SpO2: 97%  ?Weight: 274 lb (124.3 kg)  ?Height: 5\' 11"  (1.803 m)  ? ?97% on RA ?BMI Readings from Last 3 Encounters:  ?08/24/21 38.22 kg/m?  ?03/23/21 38.35 kg/m?  ?03/01/21 38.35 kg/m?  ? ?Wt Readings from Last 3 Encounters:  ?08/24/21 274 lb (124.3 kg)  ?03/23/21 275 lb (124.7 kg)  ?03/01/21 275 lb (124.7 kg)  ? ? ? ?CBC ?   ?Component Value Date/Time  ? WBC 8.9 01/10/2021 1030  ? WBC 8.0 12/05/2019 2053  ? RBC 4.73 01/10/2021 1030  ? RBC 4.89 12/05/2019 2053  ? HGB 14.0 01/10/2021 1030  ? HCT 41.9 01/10/2021 1030  ? PLT 319 01/10/2021 1030  ? MCV 89 01/10/2021 1030  ? MCH 29.6 01/10/2021 1030  ? MCH 29.7 12/05/2019 2053  ? MCHC 33.4 01/10/2021 1030  ? MCHC 33.4 12/05/2019 2053  ? RDW 13.1 01/10/2021  1030  ? LYMPHSABS 2.5 01/10/2021 1030  ? EOSABS 0.2 01/10/2021 1030  ? BASOSABS 0.0 01/10/2021 1030  ? ? ?Eos 200 ?IgE 75 ? ?Chest Imaging: ?CXR 2021 reviewed by me unremarkable ? ?CTA Chest 10/2020 reviewed by me unremarkable ? ?Pulmonary Functions Testing Results: ?No flowsheet data found. ? ?Cleda Daub 02/06/21 normal ratio, FEV1 79%  ? ? ?Echocardiogram:  ? ?  TTE 2021 normal ? ? ?   ?Assessment & Plan:  ? 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 Has in past been felt to be SCIT candidate per Dr. Lucie Leather but I can't see results of RAST testing which per pt was positive for a variety of things. Sounds like in past he has had severe GERD warranting bid PPI however EGD was normal and hasn't had opportunity to follow up with GI yet afterward. He has responded well to voice/speech therapy for component of VCD.  ? ?Plan: ?- CXR today ?- IgE, cbc/diff today ?- PFTs next visit in 6 weeks, try to hold breztri and albuterol for 2d prior to PFT unless you feel terrible off of them in which case resume ?- samples of breztri given 2 puffs twice daily with spacer. Will in meantime send message to pharmacy to price out a LABA/ICS that is compatible with spacer ?- continue current reflux measures ?- continue nasacort ?- start zyrtec 10 mg daily during allergy season ?- reinvolvement of speech for component of VCD if still has choking/dypsnea bouts ? ? ? ? ?Omar Person, MD ?[facility] ?08/24/2021 11:13 AM  ? ?

## 2021-08-24 ENCOUNTER — Ambulatory Visit: Payer: 59 | Admitting: Student

## 2021-08-24 ENCOUNTER — Other Ambulatory Visit: Payer: Self-pay

## 2021-08-24 ENCOUNTER — Ambulatory Visit (INDEPENDENT_AMBULATORY_CARE_PROVIDER_SITE_OTHER): Payer: 59

## 2021-08-24 ENCOUNTER — Encounter: Payer: Self-pay | Admitting: Student

## 2021-08-24 VITALS — BP 126/82 | HR 82 | Temp 98.9°F | Ht 71.0 in | Wt 274.0 lb

## 2021-08-24 DIAGNOSIS — R053 Chronic cough: Secondary | ICD-10-CM | POA: Diagnosis not present

## 2021-08-24 DIAGNOSIS — J4540 Moderate persistent asthma, uncomplicated: Secondary | ICD-10-CM

## 2021-08-24 DIAGNOSIS — K219 Gastro-esophageal reflux disease without esophagitis: Secondary | ICD-10-CM | POA: Diagnosis not present

## 2021-08-24 LAB — CBC WITH DIFFERENTIAL/PLATELET
Basophils Absolute: 0 10*3/uL (ref 0.0–0.1)
Basophils Relative: 0.4 % (ref 0.0–3.0)
Eosinophils Absolute: 0.2 10*3/uL (ref 0.0–0.7)
Eosinophils Relative: 1.7 % (ref 0.0–5.0)
HCT: 42 % (ref 39.0–52.0)
Hemoglobin: 14.3 g/dL (ref 13.0–17.0)
Lymphocytes Relative: 26.5 % (ref 12.0–46.0)
Lymphs Abs: 2.6 10*3/uL (ref 0.7–4.0)
MCHC: 34 g/dL (ref 30.0–36.0)
MCV: 88.6 fl (ref 78.0–100.0)
Monocytes Absolute: 0.7 10*3/uL (ref 0.1–1.0)
Monocytes Relative: 7.4 % (ref 3.0–12.0)
Neutro Abs: 6.3 10*3/uL (ref 1.4–7.7)
Neutrophils Relative %: 64 % (ref 43.0–77.0)
Platelets: 342 10*3/uL (ref 150.0–400.0)
RBC: 4.74 Mil/uL (ref 4.22–5.81)
RDW: 12.8 % (ref 11.5–15.5)
WBC: 9.8 10*3/uL (ref 4.0–10.5)

## 2021-08-24 IMAGING — DX DG CHEST 2V
2 series · 2 of 2 positions shown · non-contrast
Comparison: [DATE]

CLINICAL DATA: Chronic cough

EXAM:
CHEST - 2 VIEW

[chest pa]
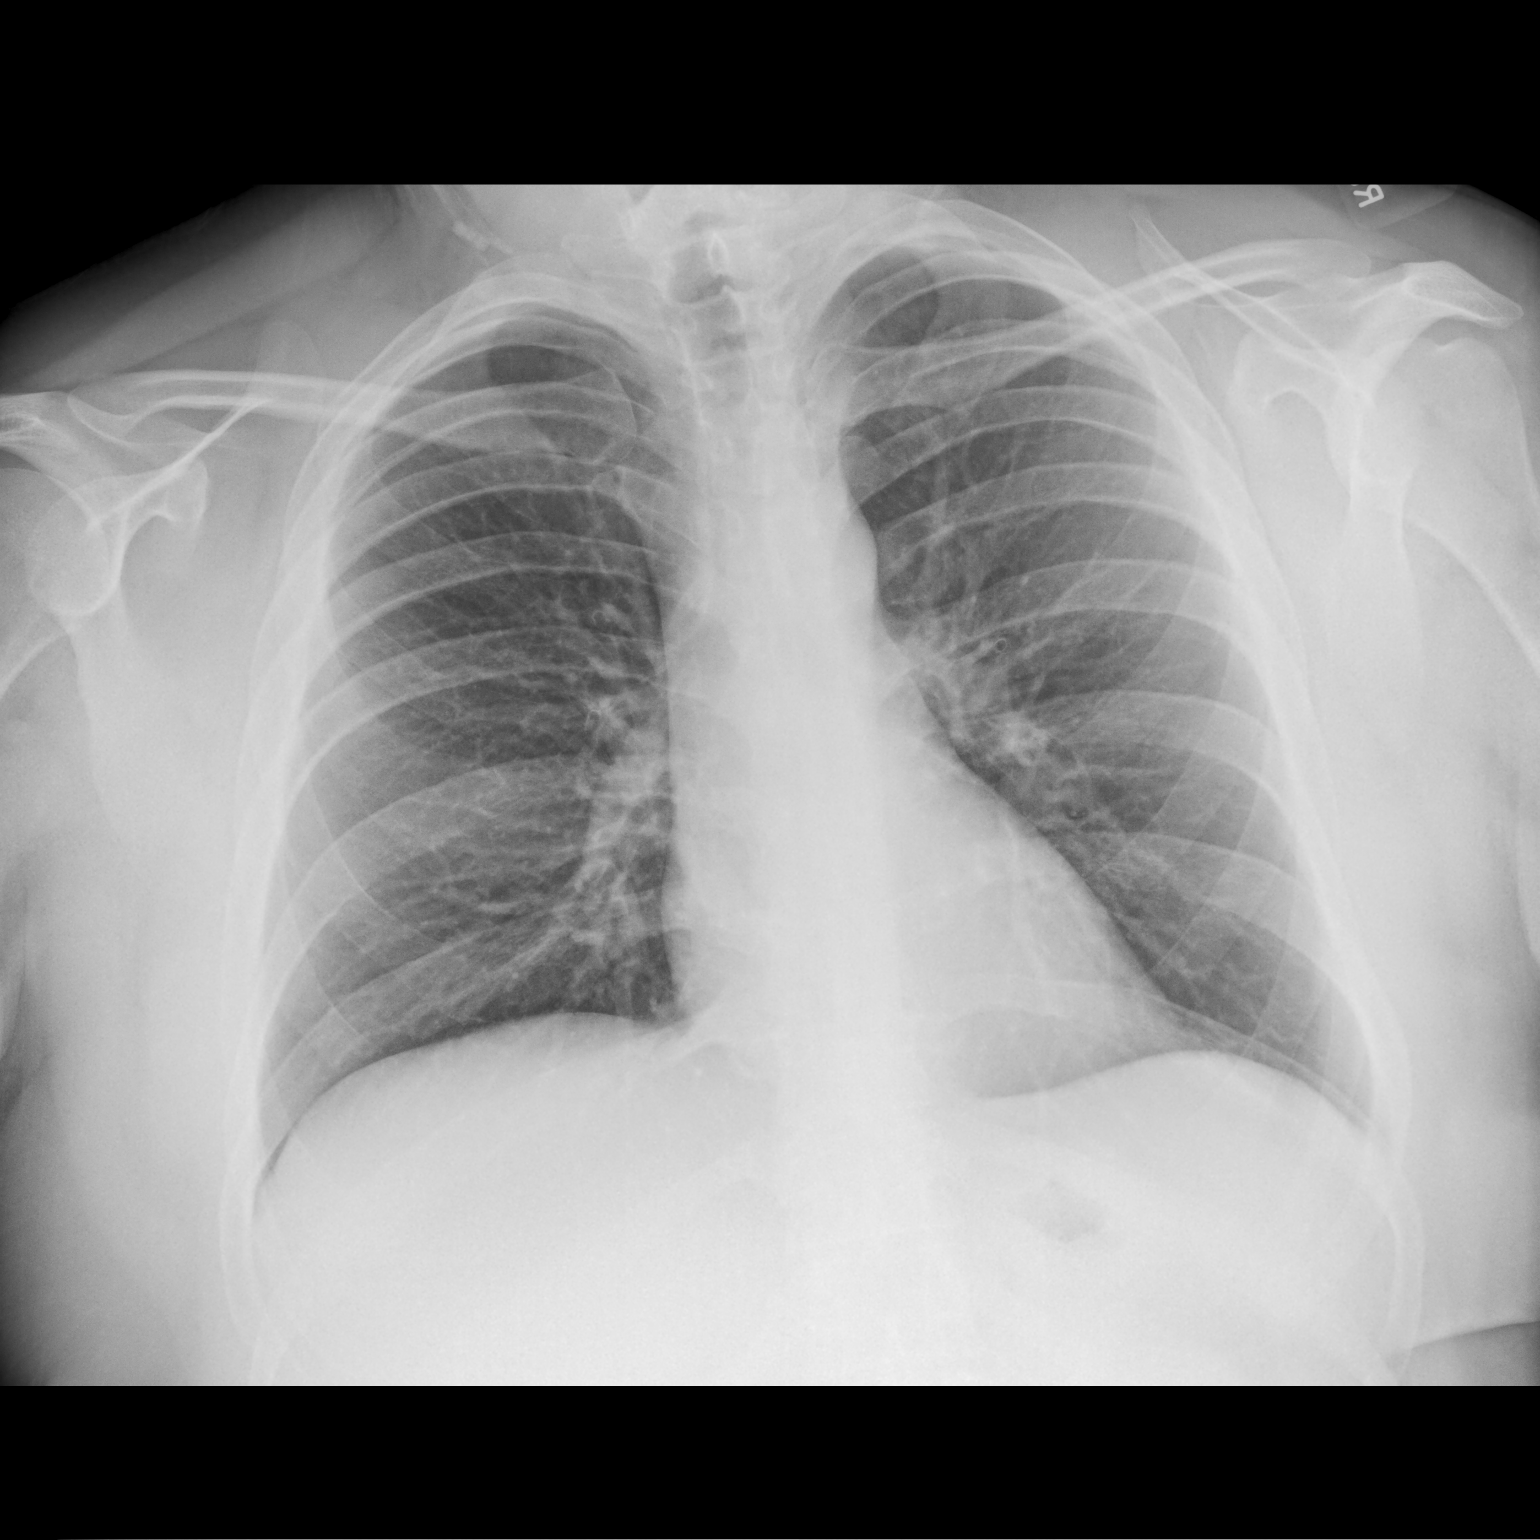

[chest lat]
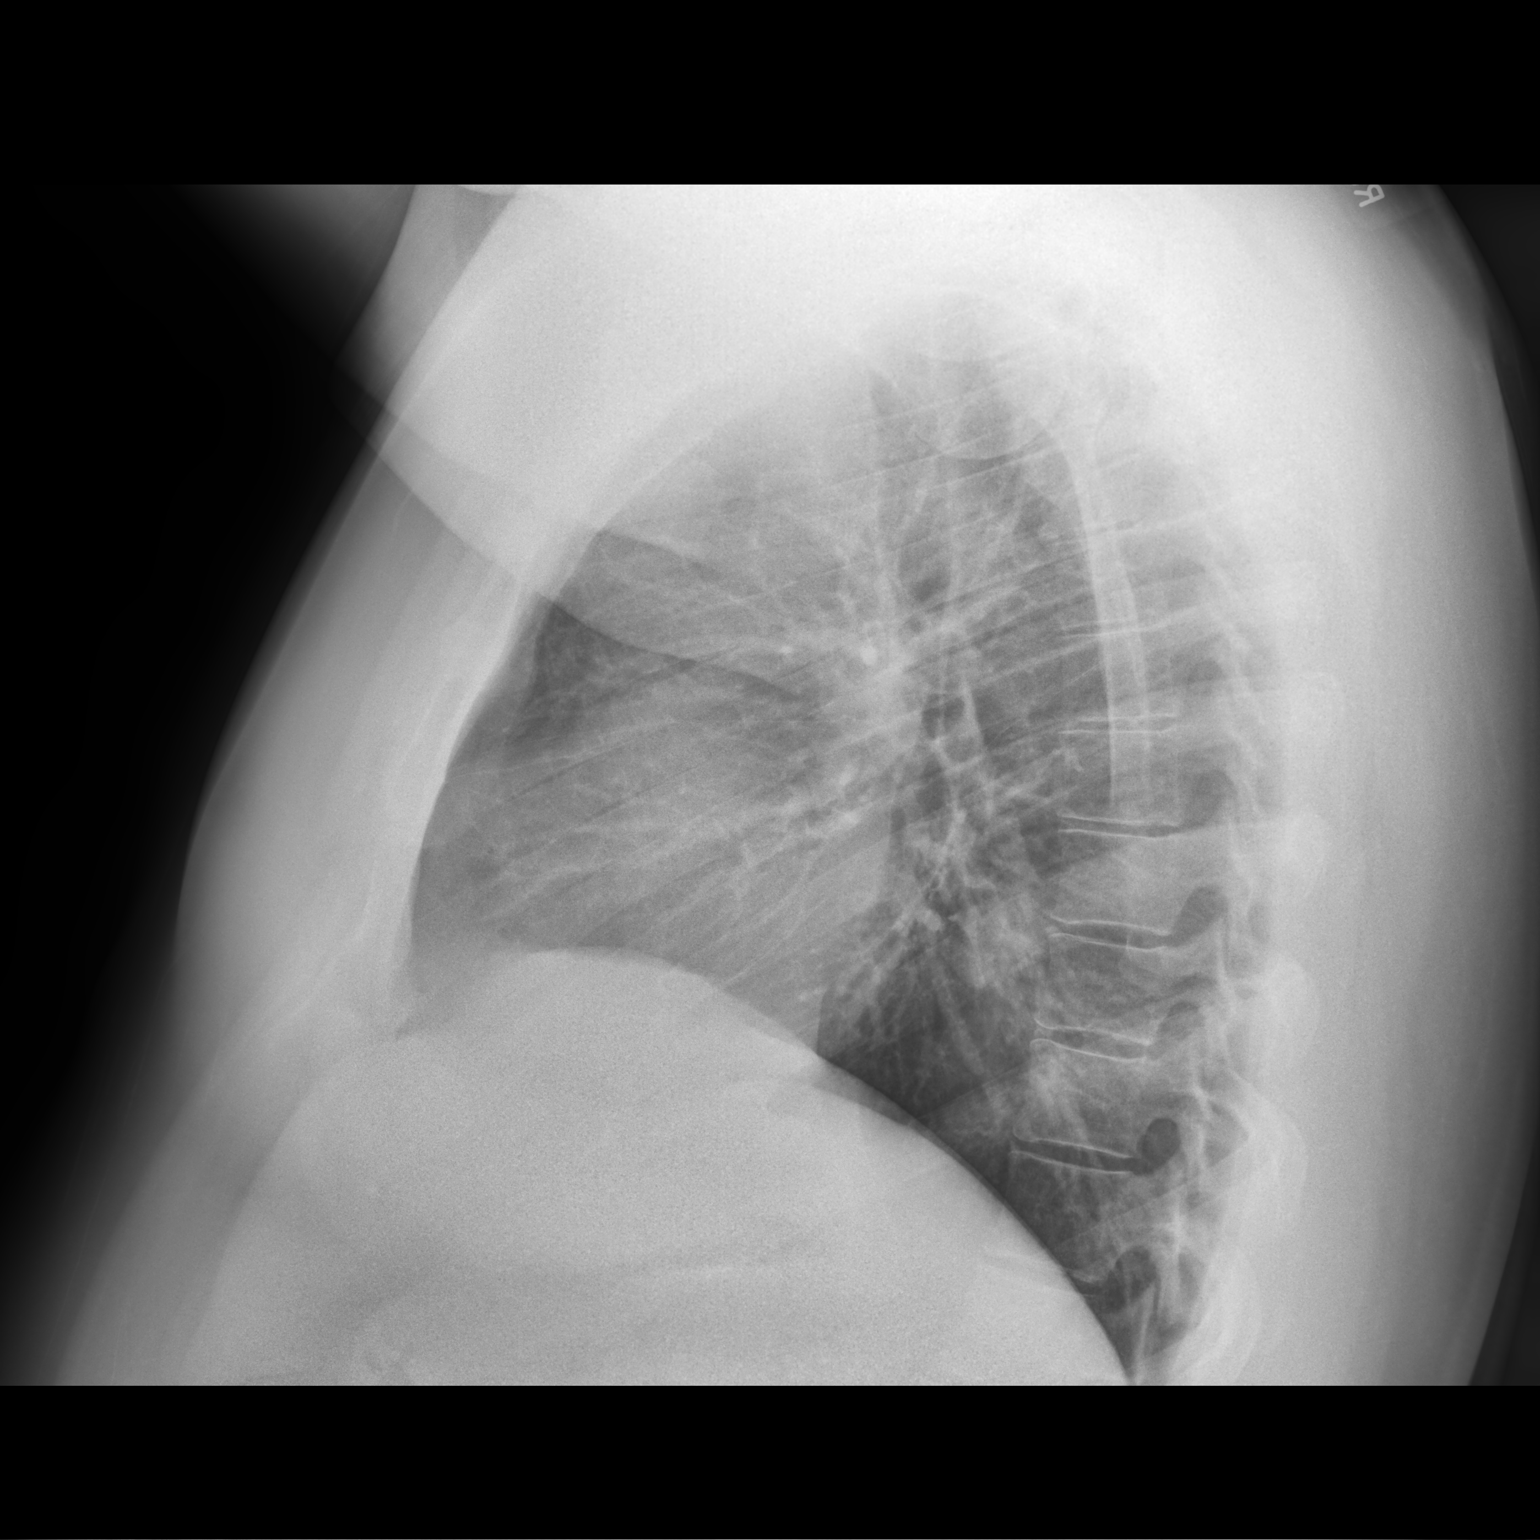

[2 of 2 positions shown; findings below may reference images not displayed]

FINDINGS: The heart size and mediastinal contours are within normal limits.
Both lungs are clear. The visualized skeletal structures are
unremarkable.
IMPRESSION: No active cardiopulmonary disease.

## 2021-08-24 MED ORDER — SPACER/AERO-HOLDING CHAMBERS DEVI
1.0000 | Freq: Two times a day (BID) | 0 refills | Status: AC
Start: 2021-08-24 — End: ?

## 2021-08-24 MED ORDER — BREZTRI AEROSPHERE 160-9-4.8 MCG/ACT IN AERO: 2.0000 | INHALATION_SPRAY | Freq: Two times a day (BID) | RESPIRATORY_TRACT | 0 refills | Status: AC

## 2021-08-24 MED ORDER — CETIRIZINE HCL 10 MG PO TABS: 10.0000 mg | ORAL_TABLET | Freq: Every day | ORAL | 11 refills | Status: AC

## 2021-08-24 NOTE — Patient Instructions (Addendum)
-   chest x ray today ?- labs today' ?- breathing tests (PFTs) next visit ?- start taking zyrtec 10 mg daily for allergy season ?- STOP airduo ?- START breztri 2 puffs twice daily with spacer, rinse mouth and gargle after use ?- see you in 6 weeks ?

## 2021-08-25 LAB — IGE: IgE (Immunoglobulin E), Serum: 101 kU/L (ref <=114)

## 2021-10-05 ENCOUNTER — Ambulatory Visit (INDEPENDENT_AMBULATORY_CARE_PROVIDER_SITE_OTHER): Payer: 59 | Admitting: Student

## 2021-10-05 ENCOUNTER — Encounter: Payer: Self-pay | Admitting: Student

## 2021-10-05 ENCOUNTER — Ambulatory Visit: Payer: 59 | Admitting: Student

## 2021-10-05 VITALS — BP 136/74 | HR 79 | Temp 98.1°F | Ht 72.0 in | Wt 273.0 lb

## 2021-10-05 DIAGNOSIS — R053 Chronic cough: Secondary | ICD-10-CM | POA: Diagnosis not present

## 2021-10-05 DIAGNOSIS — R0600 Dyspnea, unspecified: Secondary | ICD-10-CM | POA: Diagnosis not present

## 2021-10-05 DIAGNOSIS — J4540 Moderate persistent asthma, uncomplicated: Secondary | ICD-10-CM

## 2021-10-05 DIAGNOSIS — K219 Gastro-esophageal reflux disease without esophagitis: Secondary | ICD-10-CM

## 2021-10-05 LAB — PULMONARY FUNCTION TEST
DL/VA % pred: 119 %
DL/VA: 5.71 ml/min/mmHg/L
DLCO cor % pred: 92 %
DLCO cor: 31.72 ml/min/mmHg
DLCO unc % pred: 91 %
DLCO unc: 31.45 ml/min/mmHg
FEF 25-75 Post: 3.82 L/sec
FEF 25-75 Pre: 3.49 L/sec
FEF2575-%Change-Post: 9 %
FEF2575-%Pred-Post: 84 %
FEF2575-%Pred-Pre: 77 %
FEV1-%Change-Post: 3 %
FEV1-%Pred-Post: 76 %
FEV1-%Pred-Pre: 74 %
FEV1-Post: 3.57 L
FEV1-Pre: 3.47 L
FEV1FVC-%Change-Post: 4 %
FEV1FVC-%Pred-Pre: 101 %
FEV6-%Change-Post: -1 %
FEV6-%Pred-Post: 73 %
FEV6-%Pred-Pre: 74 %
FEV6-Post: 4.16 L
FEV6-Pre: 4.22 L
FEV6FVC-%Pred-Post: 101 %
FEV6FVC-%Pred-Pre: 101 %
FVC-%Change-Post: -1 %
FVC-%Pred-Post: 72 %
FVC-%Pred-Pre: 73 %
FVC-Post: 4.16 L
FVC-Pre: 4.22 L
Post FEV1/FVC ratio: 86 %
Post FEV6/FVC ratio: 100 %
Pre FEV1/FVC ratio: 82 %
Pre FEV6/FVC Ratio: 100 %
RV % pred: 99 %
RV: 1.78 L
TLC % pred: 82 %
TLC: 6.04 L

## 2021-10-05 NOTE — Progress Notes (Signed)
? ?Synopsis: Referred for moderate persistent asthma by Philemon KingdomProchnau, Caroline, MD ? ?Subjective:  ? ?PATIENT ID: Terry ServeEdward N Vanwingerden GENDER: male DOB: December 10, 1985, MRN: [id number] ? ?Chief Complaint  ?Patient presents with  ? Follow-up  ?  PFT f/u, SOB at times   ? ?36yM with history of asthma, AR, LPR followed by Dr. Lucie LeatherKozlow on singulair - no longer taking, airduo 113 1 puff BID, nasacort - daily use, protonix 40 BID. He has only ever used airduo for controller inhaler.  ? ?Had covid 07/2019 and then again in 05/2021. He is vaccinated. First course wasn't too bad. Second course went to ED. Just longstanding fever, felt like the flu.  ? ?Since second course of covid feel greater dyspnea, more fatigued overall. Over last couple of weeks as allergy season has kicked into gear has more sinonasal congestion, feels more allergy. When he was cooking recently he had episode where he felt suddenly dyspneic, with choking sensation. During night has coughing fits as well that wake him up. Wakes once weekly with these episodes. ? ?He has seen ENT - [facility]  ? ?He has no family history of lung disease ? ?He works as a Education officer, [employer]. He has never lived outside of [place]. Never smoker, vaping, MJ. He has a dog, cat.  ? ?Interval HPI: ?He says that he's still getting choking spells in the middle of the night, wakes him. Walking up stairs he gets dyspneic, with change in voice, throat tightness.  ? ?Breztri 2 puffs twice daily, rinsing mouth after use. Not much in way of postnasal drainage/sinus congestion right now. As long as he's on protonix 40 mg takes it in the morning.  ? ?He does feel sleepy during the day. Weight has gone up since back in 2010s  ? ? ?Otherwise pertinent review of systems is negative. ? ? ?Past Medical History:  ?Diagnosis Date  ? Adjustment insomnia   ? Asthma   ? BMI 36.0-36.9,adult   ? Elevated transaminase level   ? GERD without esophagitis   ? 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 Laryngopharyngeal  reflux (LPR) 07/21/2019  ? Migraine   ? Migraines 01/04/2020  ? Migraines   ? Obesity (BMI 30-39.9) 01/05/2020  ? Palpitations 01/05/2020  ? Right-sided chest wall pain   ? Sensation of lump in throat 03/27/2018  ? Shortness of breath 01/05/2020  ? Sinus arrhythmia 01/05/2020  ? Tachycardia   ?  ? ?Family History  ?Problem Relation Age of Onset  ? Cirrhosis Mother   ? Hepatitis C Mother   ? High Cholesterol Father   ? Kidney Stones Father   ? Allergies Father   ? Asthma Father   ? Asthma Brother   ? Migraines Brother   ? Breast cancer Maternal Grandmother   ? Diabetes Mellitus II Paternal Grandmother   ? Diabetes Mellitus II Paternal Grandfather   ? Prostate cancer Paternal Grandfather   ? Colon cancer Neg Hx   ? Esophageal cancer Neg Hx   ? Pancreatic cancer Neg Hx   ? Liver cancer Neg Hx   ? Stomach cancer Neg Hx   ?  ? ?Past Surgical History:  ?Procedure Laterality Date  ? NO PAST SURGERIES    ? ? ?Social History  ? ?Socioeconomic History  ? Marital status: Single  ?  Spouse name: Not on file  ? Number of children: Not on file  ? Years of education: Not on file  ? Highest education level: Not on  file  ?Occupational History  ? Not on file  ?Tobacco Use  ? Smoking status: Never  ? Smokeless tobacco: Never  ?Vaping Use  ? Vaping Use: Never used  ?Substance and Sexual Activity  ? Alcohol use: Never  ? Drug use: Never  ? Sexual activity: Not on file  ?Other Topics Concern  ? Not on file  ?Social History Narrative  ? Not on file  ? ?Social Determinants of Health  ? ?Financial Resource Strain: Not on file  ?Food Insecurity: Not on file  ?Transportation Needs: Not on file  ?Physical Activity: Not on file  ?Stress: Not on file  ?Social Connections: Not on file  ?Intimate Partner Violence: Not on file  ?  ? ?No Known Allergies  ? ?Outpatient Medications Prior to Visit  ?Medication Sig Dispense Refill  ? albuterol (VENTOLIN HFA) 108 (90 Base) MCG/ACT inhaler Inhale 2 puffs into the lungs every 4 (four) hours as needed.    ?  Budeson-Glycopyrrol-Formoterol (BREZTRI AEROSPHERE) 160-9-4.8 MCG/ACT AERO Inhale 2 puffs into the lungs in the morning and at bedtime. 5.9 g 0  ? cetirizine (ZYRTEC ALLERGY) 10 MG tablet Take 1 tablet (10 mg total) by mouth daily. 30 tablet 11  ? ibuprofen (ADVIL) 600 MG tablet Take 600 mg by mouth as needed.    ? pantoprazole (PROTONIX) 40 MG tablet Take 1 tablet (40 mg total) by mouth 2 (two) times daily. 60 tablet 2  ? Spacer/Aero-Holding Chambers DEVI 1 each by Does not apply route in the morning and at bedtime. Please use with MDI inhalers 1 each 0  ? Vitamin D, Ergocalciferol, (DRISDOL) 1.25 MG (50000 UNIT) CAPS capsule Take 50,000 Units by mouth once a week.    ? AIRDUO DIGIHALER 113-14 MCG/ACT AEPB Inhale one puff twice daily to prevent cough or wheeze.  Rinse, gargle, and spit after use. 1 each 5  ? ?No facility-administered medications prior to visit.  ? ? ? ? ? ?Objective:  ? ?Physical Exam: ? ?General appearance: 36 y.o., male, NAD, conversant  ?Eyes: anicteric sclerae; PERRL, tracking appropriately ?HENT: NCAT; MMM, mallampati 4 ?Neck: Trachea midline; no lymphadenopathy, no JVD ?Lungs: CTAB, no crackles, no wheeze, with normal respiratory effort ?CV: RRR, no murmur  ?Abdomen: Soft, non-tender; non-distended, BS present  ?Extremities: No peripheral edema, warm ?Skin: Normal turgor and texture; no rash ?Psych: Appropriate affect ?Neuro: Alert and oriented to person and place, no focal deficit  ? ? ? ?Vitals:  ? 10/05/21 1336  ?BP: 136/74  ?Pulse: 79  ?Temp: 98.1 ?F (36.7 ?C)  ?TempSrc: Oral  ?SpO2: 99%  ?Weight: 273 lb (123.8 kg)  ?Height: 6' (1.829 m)  ? ? ?99% on RA ?BMI Readings from Last 3 Encounters:  ?10/05/21 37.03 kg/m?  ?08/24/21 38.22 kg/m?  ?03/23/21 38.35 kg/m?  ? ?Wt Readings from Last 3 Encounters:  ?10/05/21 273 lb (123.8 kg)  ?08/24/21 274 lb (124.3 kg)  ?03/23/21 275 lb (124.7 kg)  ? ? ? ?CBC ?   ?Component Value Date/Time  ? WBC 9.8 08/24/2021 1135  ? RBC 4.74 08/24/2021 1135  ? HGB  14.3 08/24/2021 1135  ? HGB 14.0 01/10/2021 1030  ? HCT 42.0 08/24/2021 1135  ? HCT 41.9 01/10/2021 1030  ? PLT 342.0 08/24/2021 1135  ? PLT 319 01/10/2021 1030  ? MCV 88.6 08/24/2021 1135  ? MCV 89 01/10/2021 1030  ? MCH 29.6 01/10/2021 1030  ? MCH 29.7 12/05/2019 2053  ? MCHC 34.0 08/24/2021 1135  ? RDW 12.8 08/24/2021 1135  ?  RDW 13.1 01/10/2021 1030  ? LYMPHSABS 2.6 08/24/2021 1135  ? LYMPHSABS 2.5 01/10/2021 1030  ? MONOABS 0.7 08/24/2021 1135  ? EOSABS 0.2 08/24/2021 1135  ? EOSABS 0.2 01/10/2021 1030  ? BASOSABS 0.0 08/24/2021 1135  ? BASOSABS 0.0 01/10/2021 1030  ? ? ?Eos 200 ?IgE 101 ? ?Chest Imaging: ?CXR 08/25/20 reviewed by me stable relative to prior and unremarkable ? ?CTA Chest 10/2020 reviewed by me unremarkable ? ?Pulmonary Functions Testing Results: ? ?  Latest Ref Rng & Units 10/05/2021  ? 11:45 AM  ?PFT Results  ?FVC-Pre L 4.22  P  ?FVC-Predicted Pre % 73  P  ?FVC-Post L 4.16  P  ?FVC-Predicted Post % 72  P  ?Pre FEV1/FVC % % 82  P  ?Post FEV1/FCV % % 86  P  ?FEV1-Pre L 3.47  P  ?FEV1-Predicted Pre % 74  P  ?FEV1-Post L 3.57  P  ?DLCO uncorrected ml/min/mmHg 31.45  P  ?DLCO UNC% % 91  P  ?DLCO corrected ml/min/mmHg 31.72  P  ?DLCO COR %Predicted % 92  P  ?DLVA Predicted % 119  P  ?TLC L 6.04  P  ?TLC % Predicted % 82  P  ?RV % Predicted % 99  P  ?  ?P Preliminary result  ?Reviewed by me with nonspecific ventilatory defect, borderline mild restriction ? ?Cleda Daub 02/06/21 normal ratio, FEV1 79%  ? ? ?Echocardiogram:  ? ?TTE 2021 normal ? ? ?   ?Assessment & Plan:  ? 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 Has in past been felt to be SCIT candidate per Dr. Lucie Leather but I can't see results of RAST testing which per pt was positive for a variety of things. Sounds  like in past he has had severe GERD warranting bid PPI however EGD was normal and hasn't had opportunity to follow up with GI yet afterward. He has responded well to voice/speech therapy for component o

## 2021-10-05 NOTE — Patient Instructions (Addendum)
-   continue taking zyrtec 10 mg daily for allergy season ?- can continue breztri 2 puffs twice daily with spacer, rinse mouth and gargle after use ?- if you have trouble getting with ENT/speech let us know, happy to make referral ?- you will be called for sleep study ?- see you in 4 months or sooner if any issues arise! ?

## 2021-10-05 NOTE — Progress Notes (Signed)
PFT done today. 

## 2021-12-12 ENCOUNTER — Inpatient Hospital Stay (HOSPITAL_COMMUNITY): Payer: 59

## 2021-12-12 ENCOUNTER — Inpatient Hospital Stay (HOSPITAL_COMMUNITY)
Admission: EM | Admit: 2021-12-12 | Discharge: 2021-12-14 | DRG: 103 | Disposition: A | Discharge status: Home / Self Care | Payer: 59 | Source: Other Acute Inpatient Hospital | Attending: Neurology | Admitting: Neurology

## 2021-12-12 DIAGNOSIS — E785 Hyperlipidemia, unspecified: Secondary | ICD-10-CM | POA: Diagnosis present

## 2021-12-12 DIAGNOSIS — K760 Fatty (change of) liver, not elsewhere classified: Secondary | ICD-10-CM | POA: Diagnosis present

## 2021-12-12 DIAGNOSIS — Z8042 Family history of malignant neoplasm of prostate: Secondary | ICD-10-CM | POA: Diagnosis not present

## 2021-12-12 DIAGNOSIS — E669 Obesity, unspecified: Secondary | ICD-10-CM | POA: Diagnosis present

## 2021-12-12 DIAGNOSIS — J45909 Unspecified asthma, uncomplicated: Secondary | ICD-10-CM | POA: Diagnosis present

## 2021-12-12 DIAGNOSIS — Z83438 Family history of other disorder of lipoprotein metabolism and other lipidemia: Secondary | ICD-10-CM | POA: Diagnosis not present

## 2021-12-12 DIAGNOSIS — G43109 Migraine with aura, not intractable, without status migrainosus: Secondary | ICD-10-CM | POA: Diagnosis present

## 2021-12-12 DIAGNOSIS — I10 Essential (primary) hypertension: Secondary | ICD-10-CM | POA: Diagnosis present

## 2021-12-12 DIAGNOSIS — Z825 Family history of asthma and other chronic lower respiratory diseases: Secondary | ICD-10-CM | POA: Diagnosis not present

## 2021-12-12 DIAGNOSIS — Z833 Family history of diabetes mellitus: Secondary | ICD-10-CM

## 2021-12-12 DIAGNOSIS — Z79899 Other long term (current) drug therapy: Secondary | ICD-10-CM | POA: Diagnosis not present

## 2021-12-12 DIAGNOSIS — R350 Frequency of micturition: Secondary | ICD-10-CM | POA: Diagnosis present

## 2021-12-12 DIAGNOSIS — R2990 Unspecified symptoms and signs involving the nervous system: Secondary | ICD-10-CM | POA: Diagnosis present

## 2021-12-12 DIAGNOSIS — I639 Cerebral infarction, unspecified: Secondary | ICD-10-CM | POA: Diagnosis present

## 2021-12-12 DIAGNOSIS — R109 Unspecified abdominal pain: Secondary | ICD-10-CM | POA: Diagnosis present

## 2021-12-12 DIAGNOSIS — Z803 Family history of malignant neoplasm of breast: Secondary | ICD-10-CM | POA: Diagnosis not present

## 2021-12-12 DIAGNOSIS — K219 Gastro-esophageal reflux disease without esophagitis: Secondary | ICD-10-CM | POA: Diagnosis present

## 2021-12-12 DIAGNOSIS — G4700 Insomnia, unspecified: Secondary | ICD-10-CM | POA: Diagnosis present

## 2021-12-12 DIAGNOSIS — Z9282 Status post administration of tPA (rtPA) in a different facility within the last 24 hours prior to admission to current facility: Secondary | ICD-10-CM | POA: Diagnosis not present

## 2021-12-12 DIAGNOSIS — Z6836 Body mass index (BMI) 36.0-36.9, adult: Secondary | ICD-10-CM | POA: Diagnosis not present

## 2021-12-12 DIAGNOSIS — Z91013 Allergy to seafood: Secondary | ICD-10-CM

## 2021-12-12 DIAGNOSIS — R3915 Urgency of urination: Secondary | ICD-10-CM | POA: Diagnosis present

## 2021-12-12 DIAGNOSIS — I6389 Other cerebral infarction: Secondary | ICD-10-CM

## 2021-12-12 LAB — URINALYSIS, ROUTINE W REFLEX MICROSCOPIC
Bilirubin Urine: NEGATIVE
Glucose, UA: NEGATIVE mg/dL
Hgb urine dipstick: NEGATIVE
Ketones, ur: NEGATIVE mg/dL
Leukocytes,Ua: NEGATIVE
Nitrite: NEGATIVE
Protein, ur: NEGATIVE mg/dL
Specific Gravity, Urine: 1.016 (ref 1.005–1.030)
pH: 5 (ref 5.0–8.0)

## 2021-12-12 LAB — RAPID URINE DRUG SCREEN, HOSP PERFORMED
Amphetamines: NOT DETECTED
Barbiturates: NOT DETECTED
Benzodiazepines: NOT DETECTED
Cocaine: NOT DETECTED
Opiates: NOT DETECTED
Tetrahydrocannabinol: NOT DETECTED

## 2021-12-12 LAB — MRSA NEXT GEN BY PCR, NASAL: MRSA by PCR Next Gen: NOT DETECTED

## 2021-12-12 IMAGING — MR MR HEAD W/O CM
12 of 13 series · 44 of 48 positions shown · non-contrast
Comparison: Same-day brain MRI [DATE]. Head CT [DATE].

CLINICAL DATA: Provided history: Neuro deficit, acute, stroke
suspected.

EXAM:
MRI HEAD WITHOUT CONTRAST
TECHNIQUE: Multiplanar, multiecho pulse sequences of the brain and surrounding
structures were obtained without intravenous contrast.

[Series 5: DWI · axial · 3.0mm · 0.88mm/px · z∈[-114,+37]mm · 9 of 104 slices shown (1 of 4)]
[im 1/104]
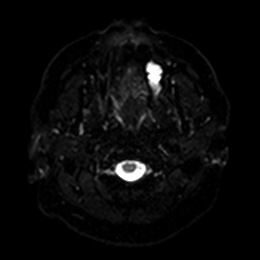
[im 13/104]
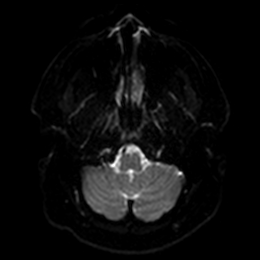
[im 26/104]
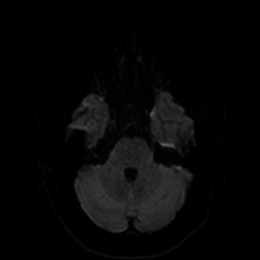
[im 39/104]
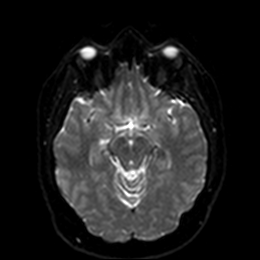
[im 52/104]
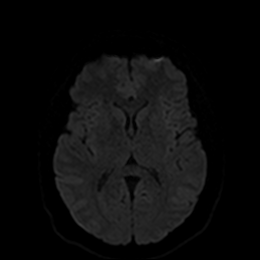
[im 65/104]
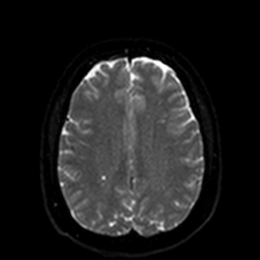
[im 78/104]
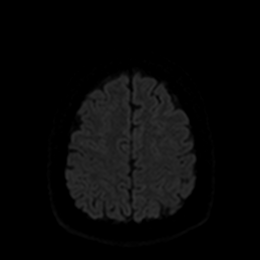
[im 91/104]
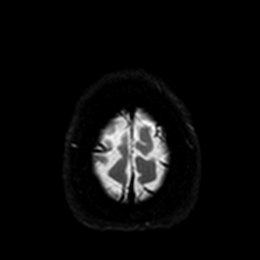
[im 104/104]
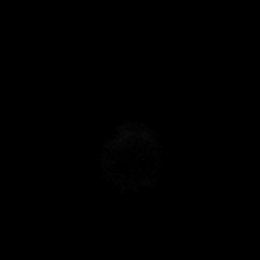

[Series 6: DWI · axial · 3.0mm · 0.88mm/px · z∈[-114,+37]mm · 4 of 52 slices shown (2 of 4)]
[im 1/52]
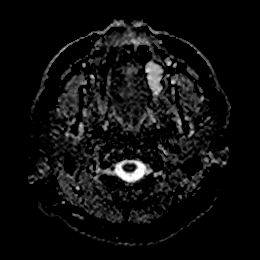
[im 18/52]
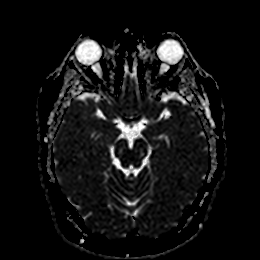
[im 35/52]
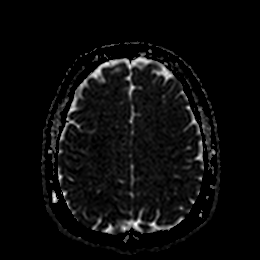
[im 52/52]
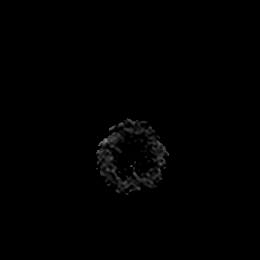

[Series 7: DWI · coronal · 4.0mm · 0.88mm/px · 5 of 72 slices shown (3 of 4)]
[im 1/72]
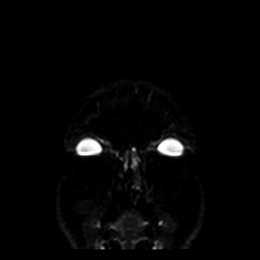
[im 18/72]
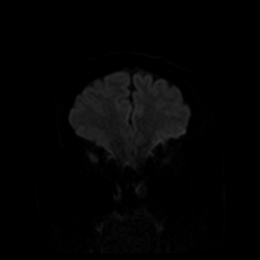
[im 36/72]
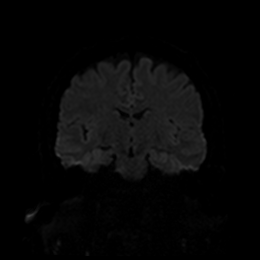
[im 54/72]
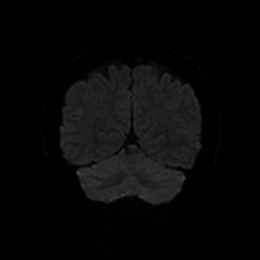
[im 72/72]
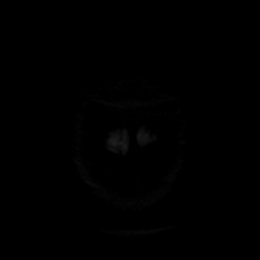

[Series 8: DWI · coronal · 4.0mm · 0.88mm/px · 3 of 36 slices shown (4 of 4)]
[im 1/36]
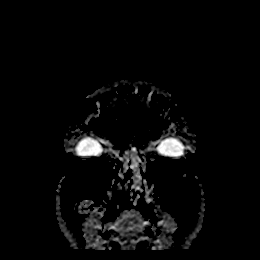
[im 18/36]
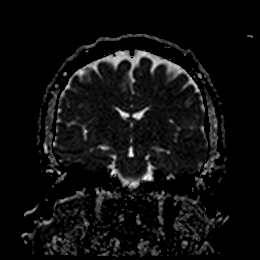
[im 36/36]
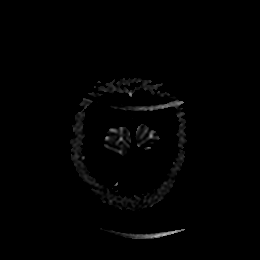

[Series 9: T1 · sagittal · 5.0mm · 0.75mm/px · 2 of 25 slices shown]
[im 1/25]
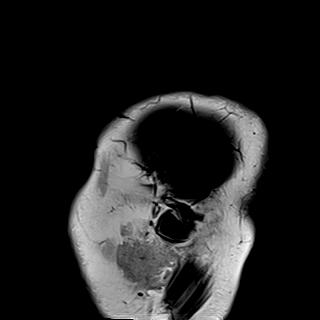
[im 25/25]
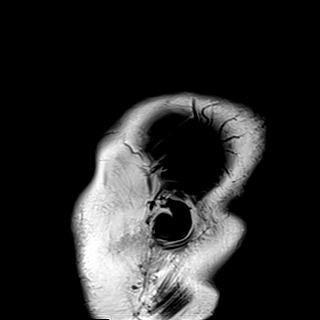

[Series 10: T2 · axial · 5.0mm · 0.72mm/px · z∈[-115,+38]mm · 2 of 27 slices shown (1 of 2)]
[im 1/27]
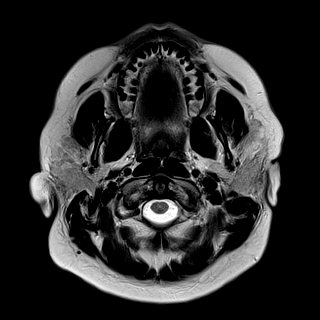
[im 27/27]
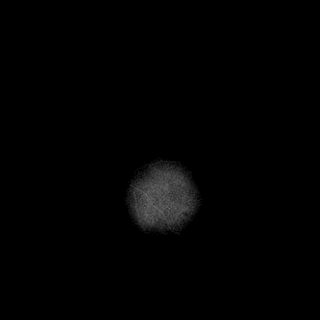

[Series 11: FLAIR · axial · 5.0mm · 0.45mm/px · z∈[-115,+38]mm · 2 of 27 slices shown]
[im 1/27]
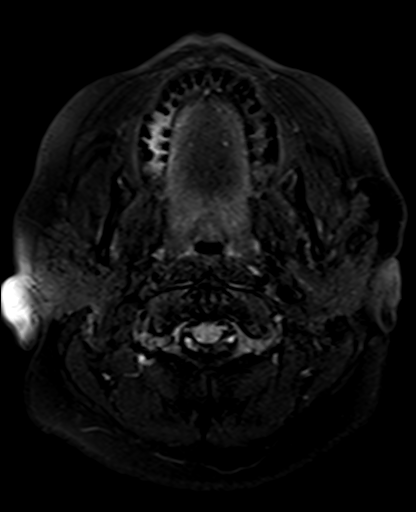
[im 27/27]
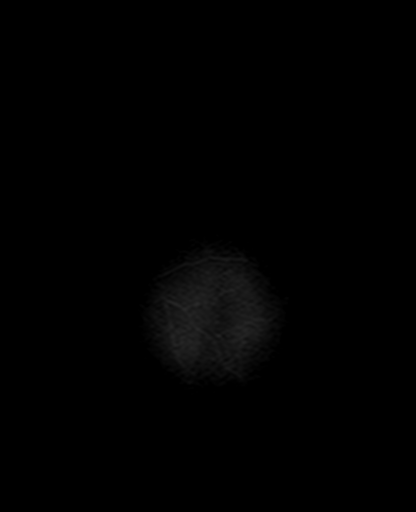

[Series 12: mag_images · axial · 3.0mm · 0.90mm/px · z∈[-113,+37]mm · 4 of 52 slices shown]
[im 1/52]
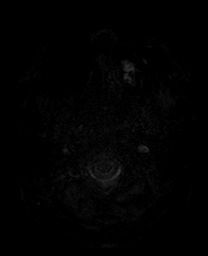
[im 18/52]
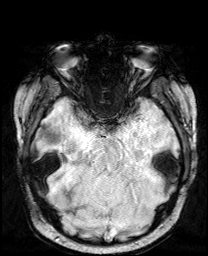
[im 35/52]
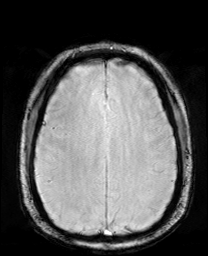
[im 52/52]
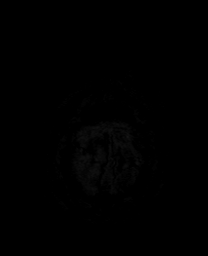

[Series 13: pha_images · axial · 3.0mm · 0.90mm/px · z∈[-113,+37]mm · 4 of 52 slices shown]
[im 1/52]
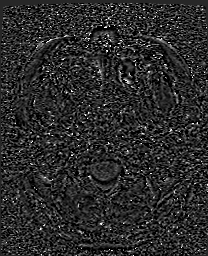
[im 18/52]
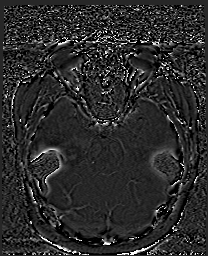
[im 35/52]
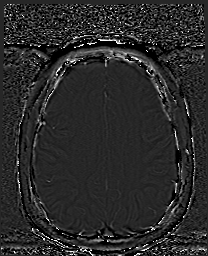
[im 52/52]
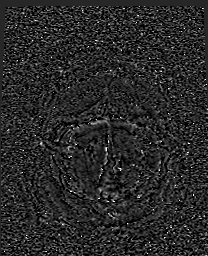

[Series 14: swi_images · axial · 3.0mm · 0.90mm/px · z∈[-113,+37]mm · 4 of 52 slices shown]
[im 1/52]
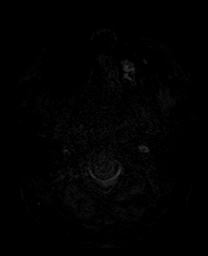
[im 18/52]
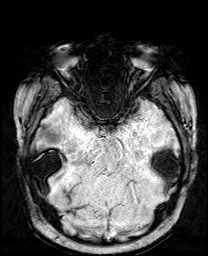
[im 35/52]
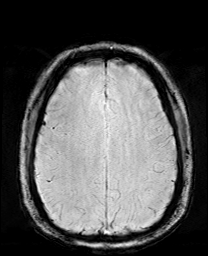
[im 52/52]
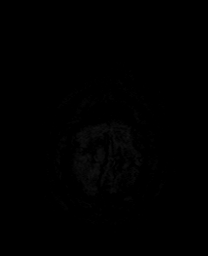

[Series 15: mip_images(sw) · axial · 24.0mm · 0.90mm/px · z∈[-103,+26]mm · 3 of 45 slices shown]
[im 1/45]
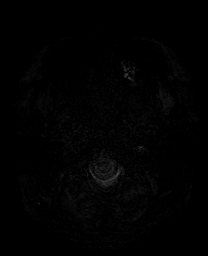
[im 23/45]
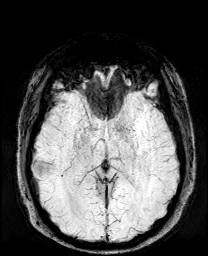
[im 45/45]
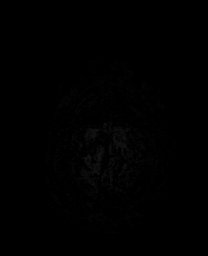

[Series 17: T2 · coronal · 5.0mm · 0.34mm/px · 2 of 32 slices shown (2 of 2)]
[im 1/32]
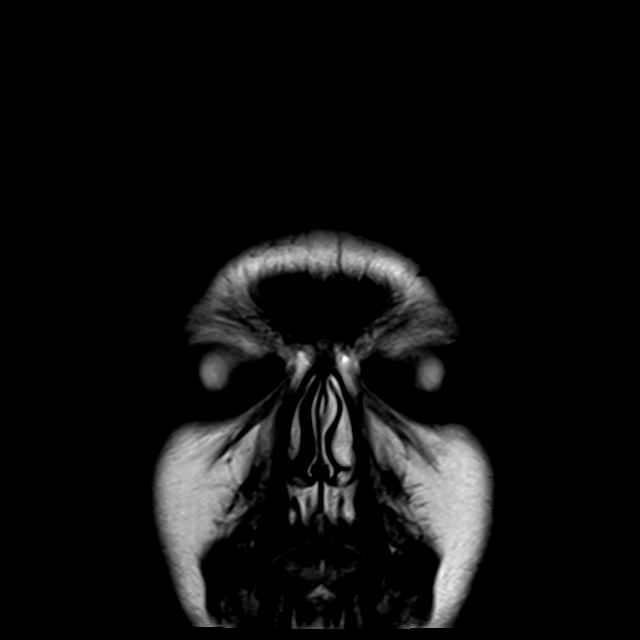
[im 32/32]
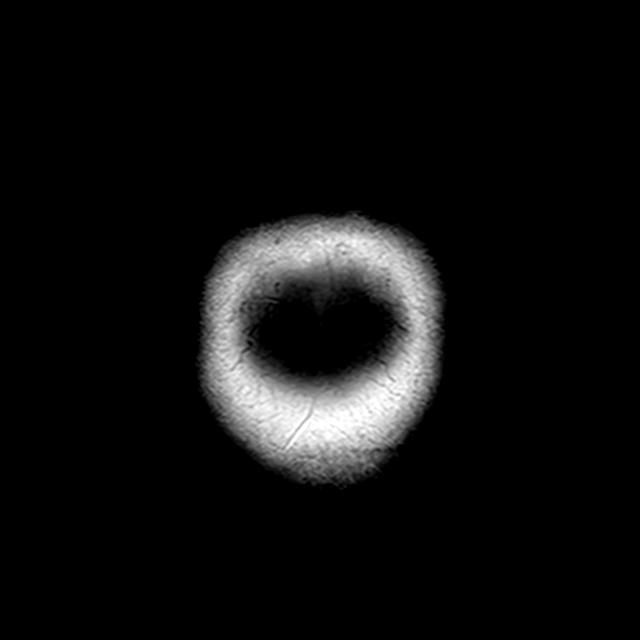

[44 of 48 positions shown; findings below may reference images not displayed]

FINDINGS: Brain:

Cerebral volume is normal.

No cortical encephalomalacia is identified. No significant cerebral
white matter disease.

There is no acute infarct.

No evidence of an intracranial mass.

No chronic intracranial blood products.

No extra-axial fluid collection.

No midline shift.

Vascular: Maintained flow voids within the proximal large arterial
vessels.

Skull and upper cervical spine: No focal suspicious marrow lesion.

Sinuses/Orbits: No mass or acute finding within the imaged orbits.
Small mucous retention cyst within the right maxillary sinus. 2.3 cm
mucous retention cyst within the left maxillary sinus. 2.7 cm mucous
retention cyst within the left sphenoid sinus.
IMPRESSION: Unremarkable non-contrast MRI appearance of the brain. No evidence
of acute intracranial abnormality.

Paranasal sinus mucous retention cysts, as described.

## 2021-12-12 IMAGING — CT CT CHEST-ABD-PELV W/O CM
2 of 4 series · 14 of 36 positions shown, 16 images · non-contrast
Comparison: CT abdomen pelvis dated [DATE].

CLINICAL DATA: Abdominal pain.



[Series 3: cap wo 5.0 i31f 2 · axial · 0.98mm/px · z∈[+773,+1368]mm · 11 of 143 slices shown, 13 images]
[im 12/143  mediastinal]
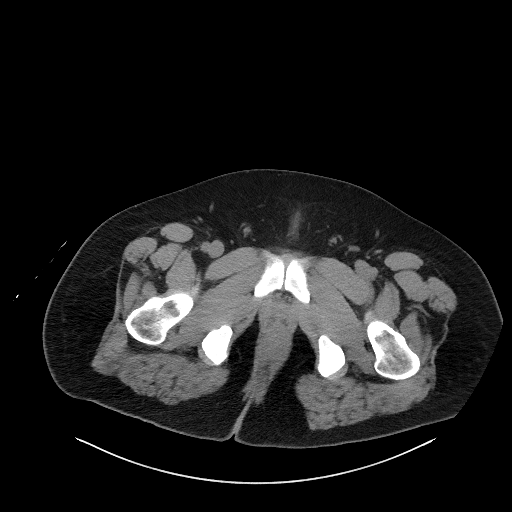
[im 12/143  bone]
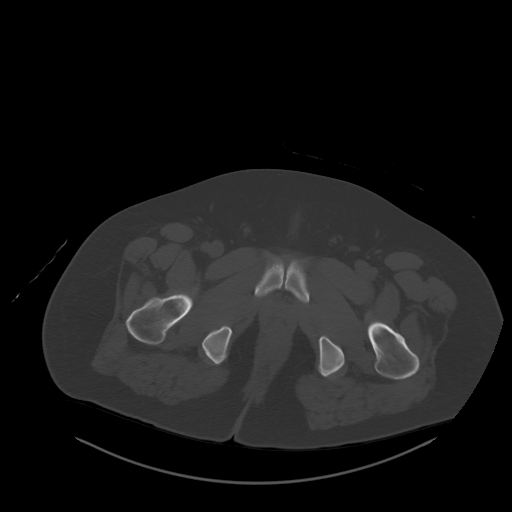
[im 24/143  mediastinal]
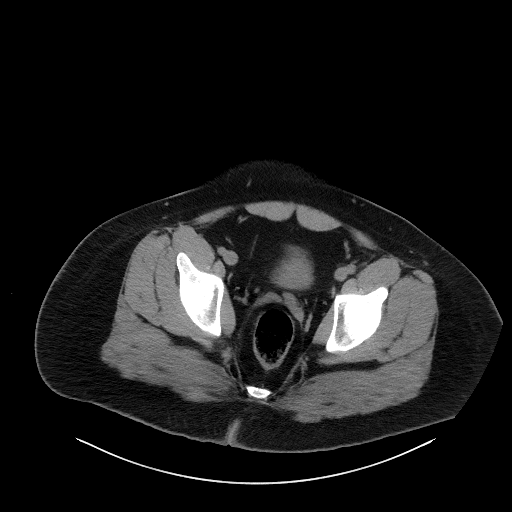
[im 36/143  mediastinal]
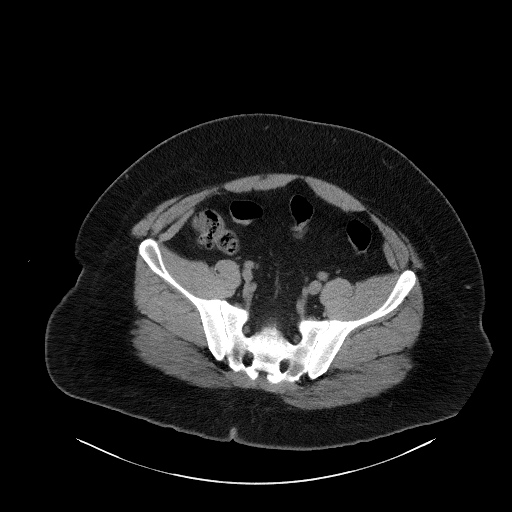
[im 48/143  mediastinal]
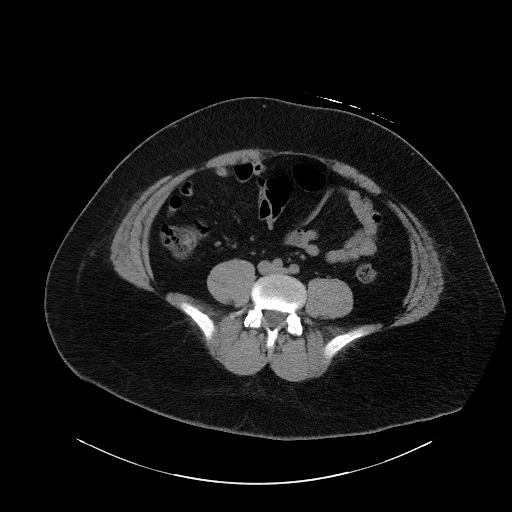
[im 60/143  mediastinal]
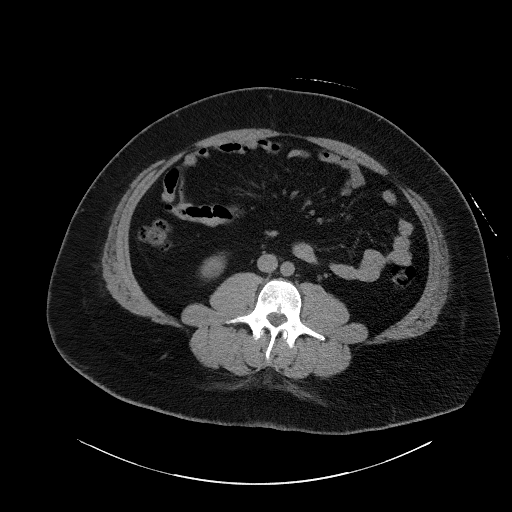
[im 72/143  mediastinal]
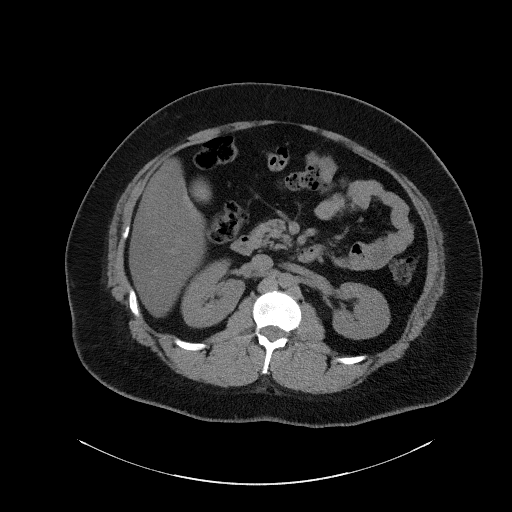
[im 83/143  mediastinal]
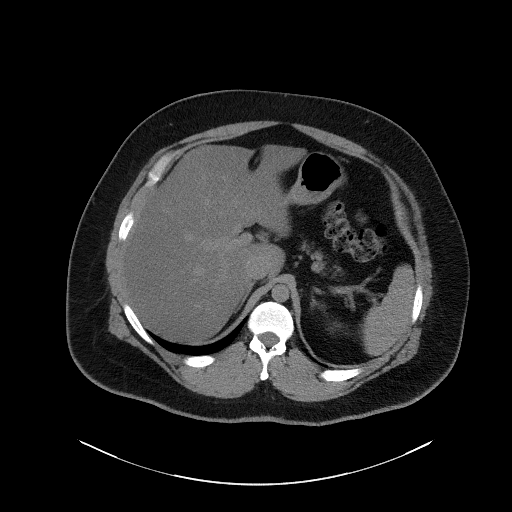
[im 95/143  mediastinal]
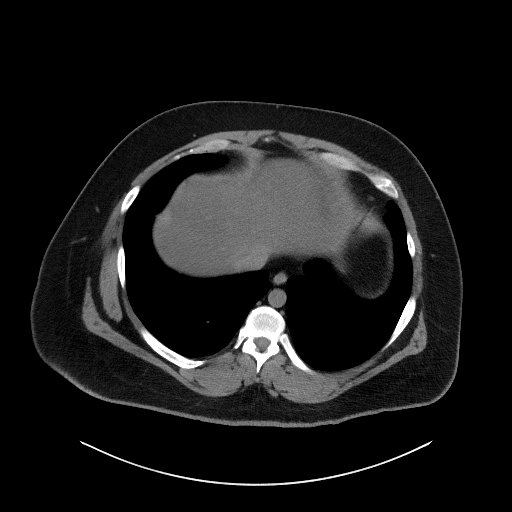
[im 107/143  mediastinal]
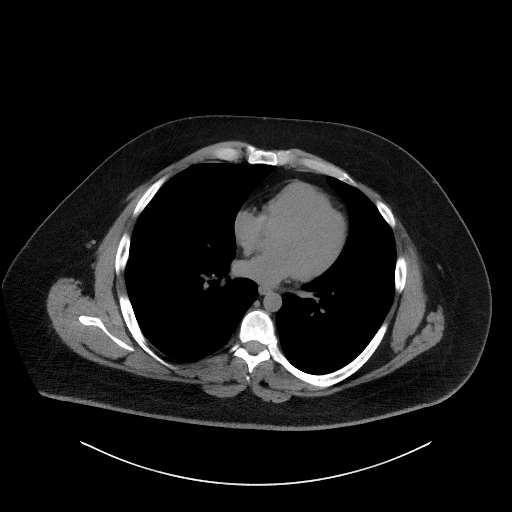
[im 107/143  bone]
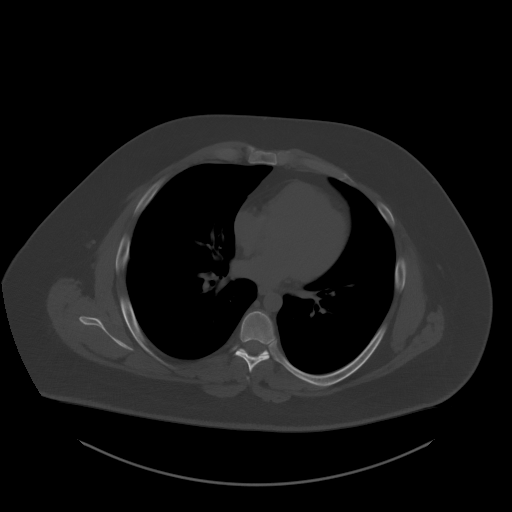
[im 119/143  mediastinal]
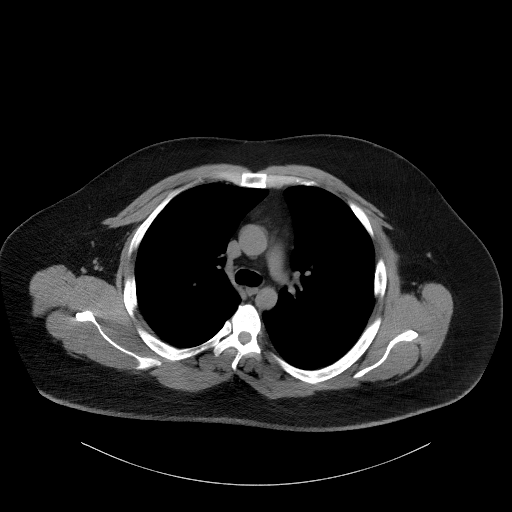
[im 131/143  mediastinal]
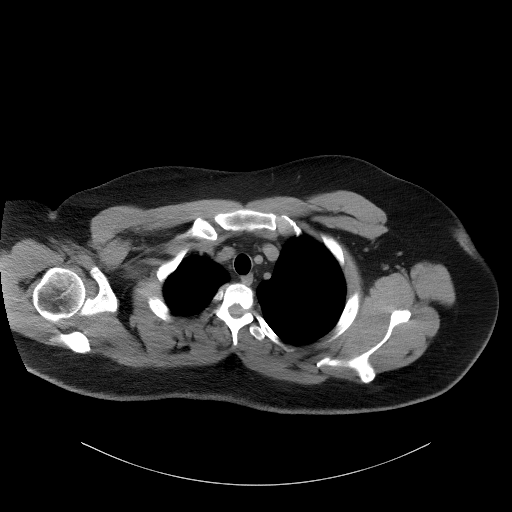

[Series 6: coronal · coronal · 0.94mm/px · 3 of 209 slices shown]
[im 42/209  mediastinal]
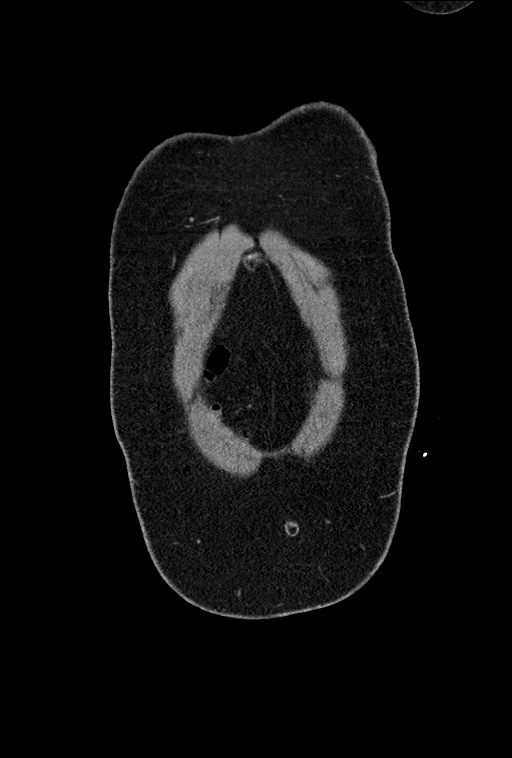
[im 84/209  mediastinal]
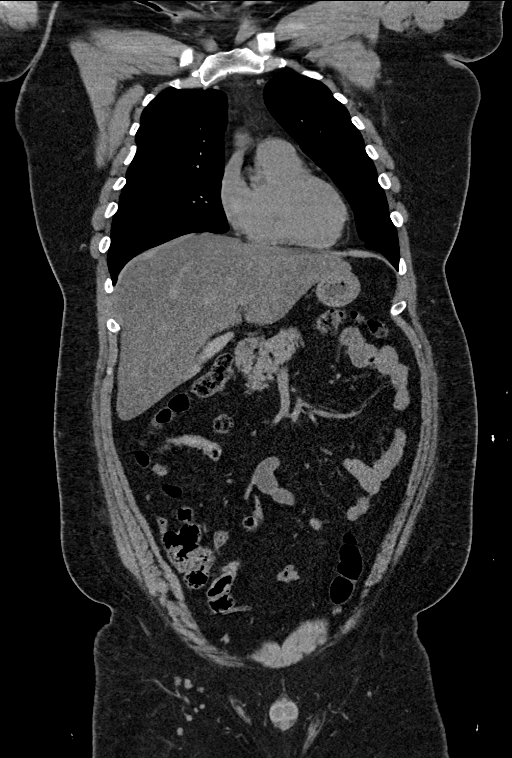
[im 125/209  mediastinal]
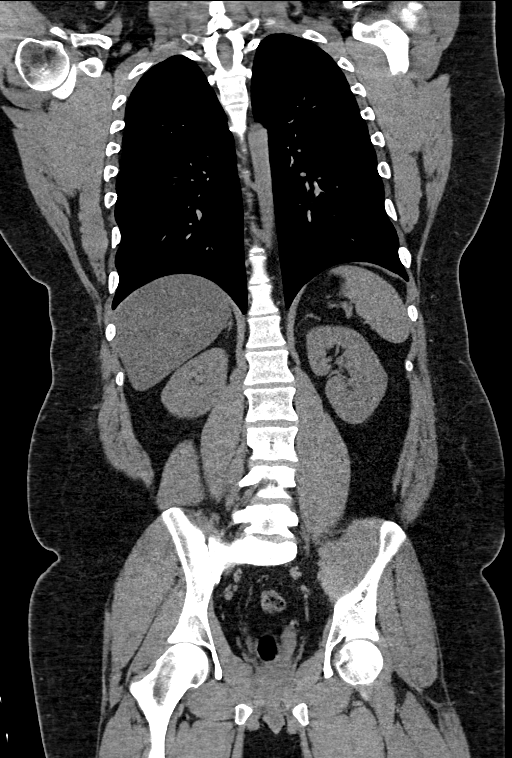

[14 of 36 positions shown; findings below may reference images not displayed]

FINDINGS: Evaluation of this exam is limited in the absence of intravenous
contrast.

CT CHEST FINDINGS

Cardiovascular: There is no cardiomegaly or pericardial effusion.
The thoracic aorta and central pulmonary arteries are unremarkable
on this noncontrast CT.

Mediastinum/Nodes: No hilar or mediastinal adenopathy. The esophagus
and the thyroid gland are grossly unremarkable. No mediastinal fluid
collection.

Lungs/Pleura: No focal consolidation, pleural effusion, or
pneumothorax. There is a 5 mm right lower lobe nodule (86/5). A 5 mm
focus of scarring versus subpleural nodule in the anterior right
middle lobe (106/5). The central airways are patent.

Musculoskeletal: No chest wall mass or suspicious bone lesions
identified.

CT ABDOMEN PELVIS FINDINGS

No intra-abdominal free air or free fluid.

Hepatobiliary: Fatty liver. No intrahepatic biliary dilatation. High
attenuating content within the gallbladder, likely vicariously
excreted contrast from recent administration.

Pancreas: Unremarkable. No pancreatic ductal dilatation or
surrounding inflammatory changes.

Spleen: Normal in size without focal abnormality.

Adrenals/Urinary Tract: The adrenal glands are unremarkable. The
kidneys, visualized ureters, and urinary bladder appear
unremarkable.

Stomach/Bowel: There is no bowel obstruction or active inflammation.
The appendix is normal.

Vascular/Lymphatic: The abdominal aorta and IVC are unremarkable on
this noncontrast CT. No portal venous gas. There is no adenopathy

Reproductive: The prostate and seminal vesicles are grossly
unremarkable. No pelvic mass.

Other: None

Musculoskeletal: No acute or significant osseous findings.
IMPRESSION: 1. No acute intrathoracic, abdominal, or pelvic pathology.
2. Fatty liver.

## 2021-12-12 IMAGING — MR MR CERVICAL SPINE W/O CM
4 of 5 series · 19 of 48 positions shown · non-contrast
Comparison: [DATE] MRI cervical spine

CLINICAL DATA: Cervical myelopathy

EXAM:
MRI CERVICAL SPINE WITHOUT CONTRAST
TECHNIQUE: Multiplanar, multisequence MR imaging of the cervical spine was
performed. No intravenous contrast was administered.

[Series 2: T2 · sagittal · 3.0mm · 0.43mm/px · 4 of 16 slices shown (1 of 2)]
[im 1/16]
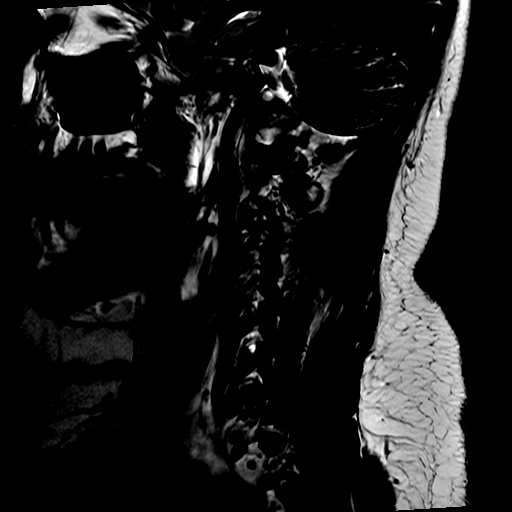
[im 6/16]
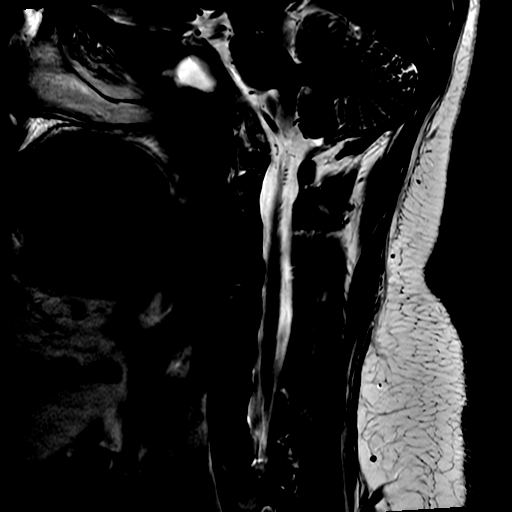
[im 11/16]
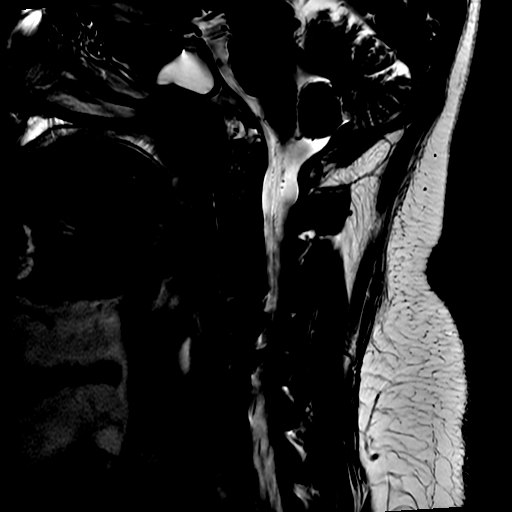
[im 16/16]
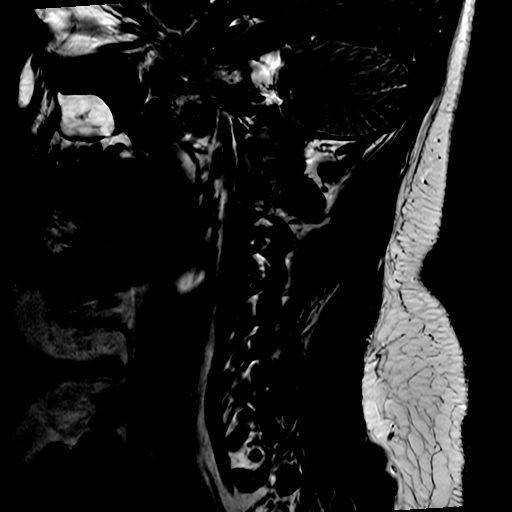

[Series 3: FLAIR · sagittal · 3.0mm · 0.43mm/px · 3 of 16 slices shown]
[im 1/16]
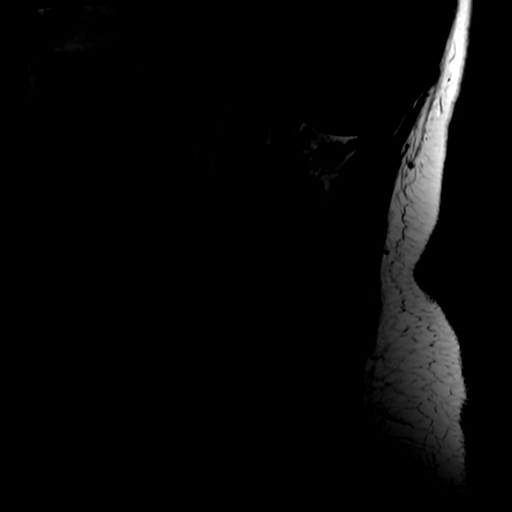
[im 11/16]
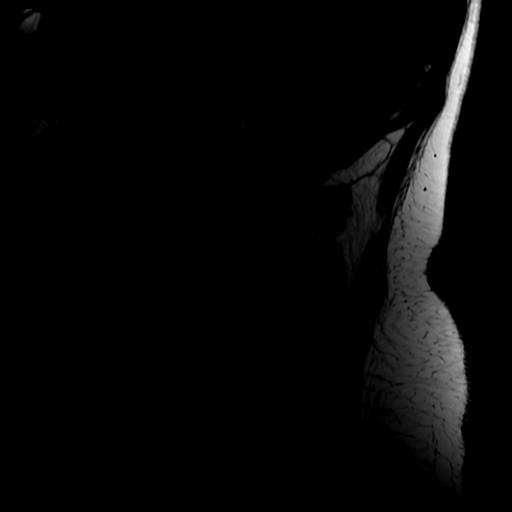
[im 16/16]
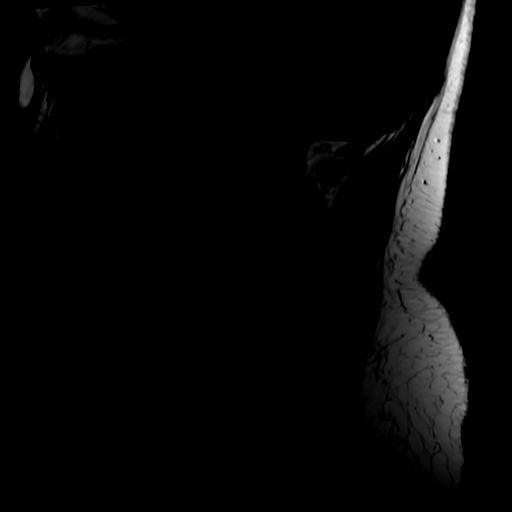

[Series 4: STIR · sagittal · 3.0mm · 0.43mm/px · 4 of 16 slices shown]
[im 1/16]
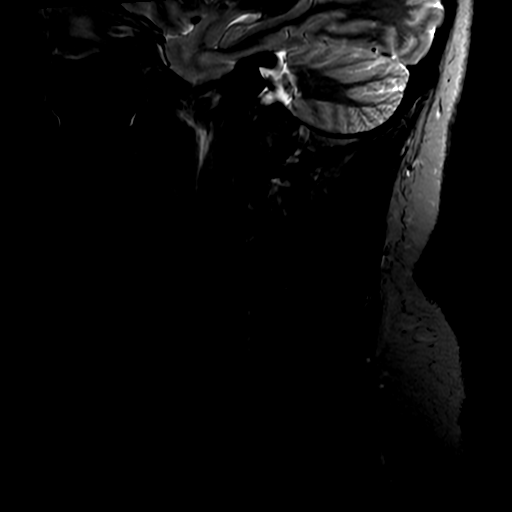
[im 6/16]
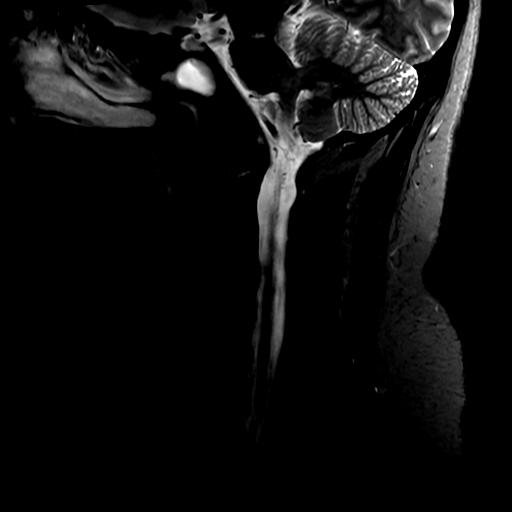
[im 11/16]
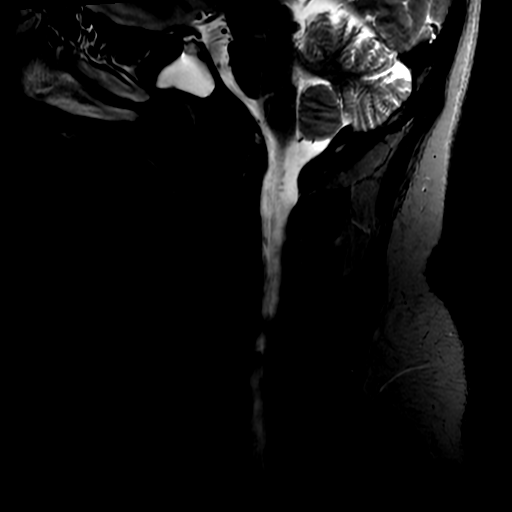
[im 16/16]
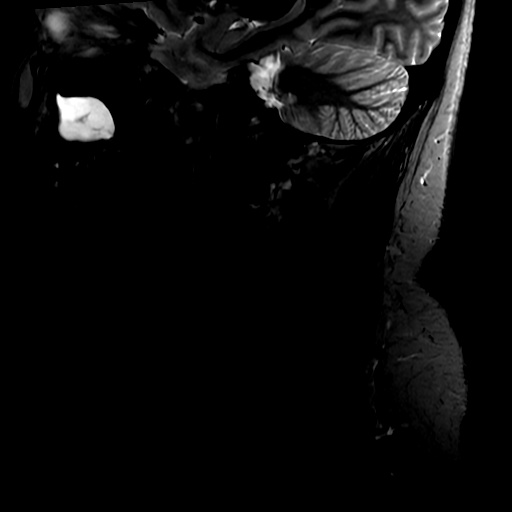

[Series 6: T2 · axial · 3.0mm · 0.35mm/px · z∈[-61,+70]mm · 8 of 40 slices shown (2 of 2)]
[im 1/40]
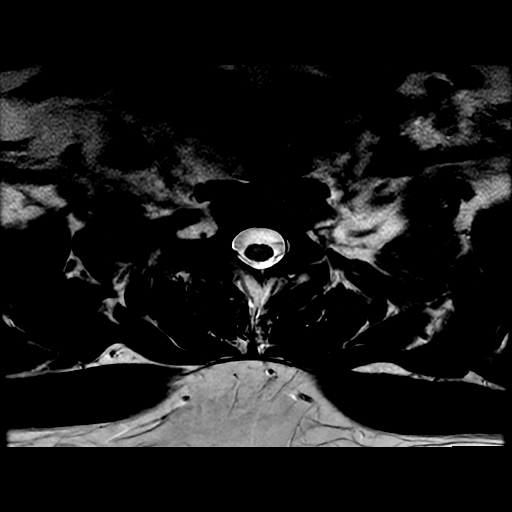
[im 5/40]
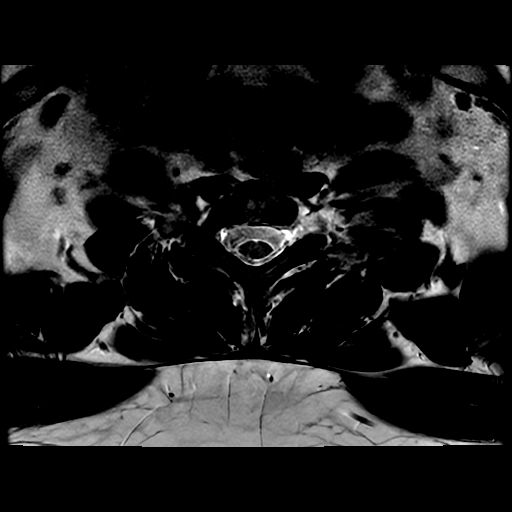
[im 14/40]
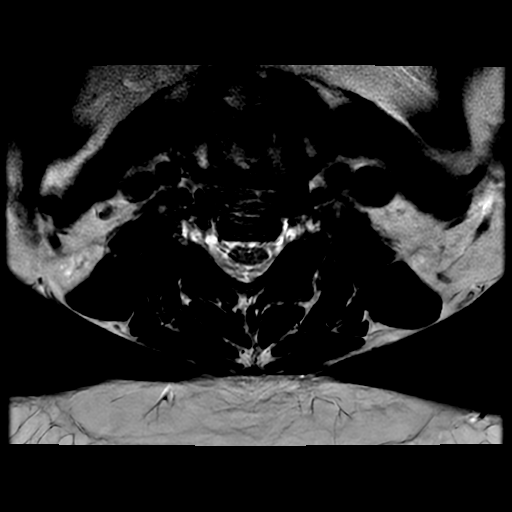
[im 18/40]
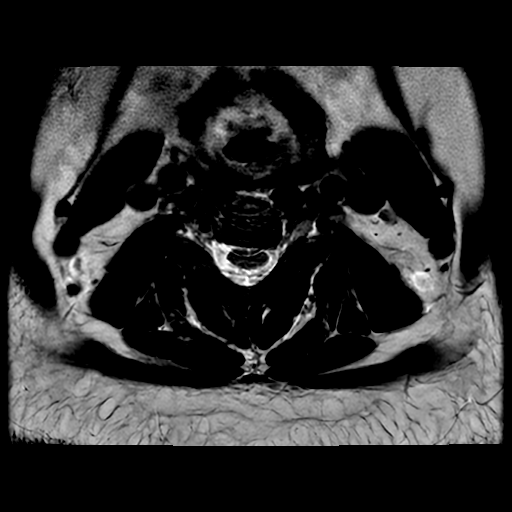
[im 22/40]
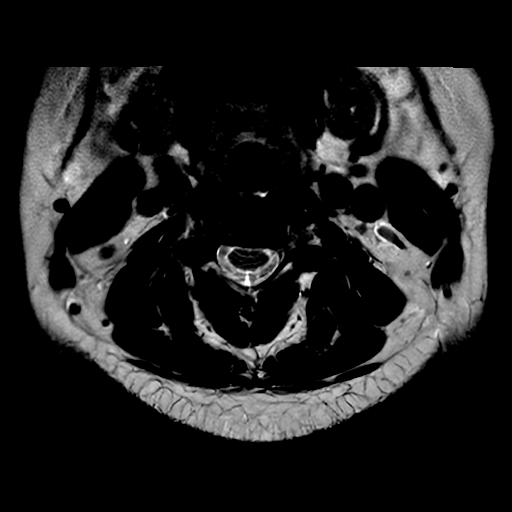
[im 27/40]
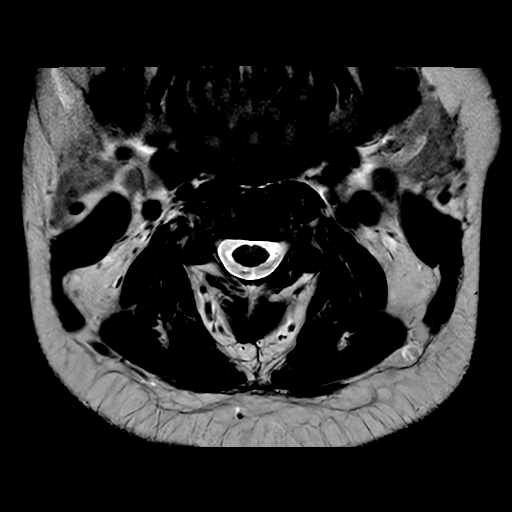
[im 35/40]
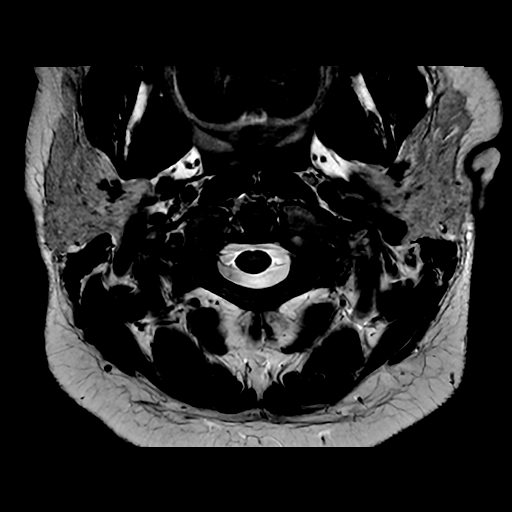
[im 40/40]
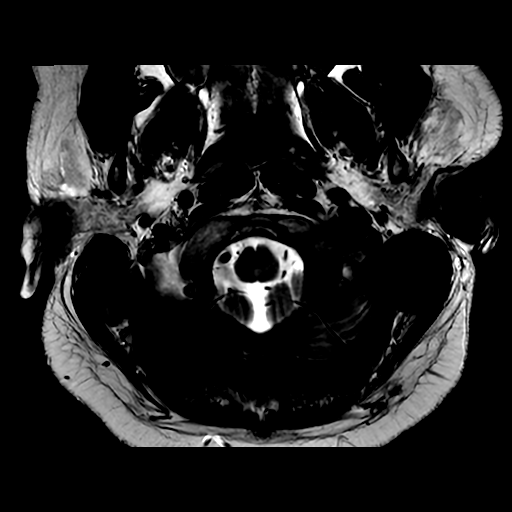

[19 of 48 positions shown; findings below may reference images not displayed]

FINDINGS: Evaluation is somewhat limited by motion artifact.

Alignment: Straightening and reversal of the normal cervical
lordosis. No significant listhesis.

Vertebrae: No acute fracture or suspicious osseous lesion.

Cord: Normal signal and morphology.

Posterior Fossa, vertebral arteries, paraspinal tissues: No acute
finding. Redemonstrated mucous retention cyst in the sphenoid sinus.

Disc levels:

C2-C3: No significant disc bulge. No spinal canal stenosis or
neuroforaminal narrowing.

C3-C4: No significant disc bulge. No spinal canal stenosis or
neuroforaminal narrowing.

C4-C5: No significant disc bulge. Left uncovertebral hypertrophy. No
spinal canal stenosis. Mild left neural foraminal narrowing,
unchanged.

C5-C6: No significant disc bulge. No spinal canal stenosis or
neuroforaminal narrowing.

C6-C7: Small central disc protrusion with central annular fissure.
No spinal canal stenosis or neural foraminal narrowing.

C7-T1: No significant disc bulge. No spinal canal stenosis or
neuroforaminal narrowing.
IMPRESSION: 1. Unchanged C4-C5 mild left neural foraminal narrowing.
2. No spinal canal stenosis or additional neural foraminal
narrowing.

## 2021-12-12 MED ORDER — STROKE: EARLY STAGES OF RECOVERY BOOK
Freq: Once | Status: AC
  Filled 2021-12-12: qty 1

## 2021-12-12 MED ORDER — PANTOPRAZOLE SODIUM 40 MG IV SOLR
40.0000 mg | Freq: Every day | INTRAVENOUS | Status: DC
  Administered 2021-12-12: 40 mg via INTRAVENOUS
  Filled 2021-12-12: qty 10

## 2021-12-12 MED ORDER — BETHANECHOL CHLORIDE 10 MG PO TABS: 10.0000 mg | ORAL_TABLET | Freq: Three times a day (TID) | ORAL | Status: DC

## 2021-12-12 MED ORDER — ACETAMINOPHEN 650 MG RE SUPP: 650.0000 mg | RECTAL | Status: DC | PRN

## 2021-12-12 MED ORDER — ACETAMINOPHEN 325 MG PO TABS: 650.0000 mg | ORAL_TABLET | ORAL | Status: DC | PRN

## 2021-12-12 MED ORDER — SENNOSIDES-DOCUSATE SODIUM 8.6-50 MG PO TABS: 1.0000 | ORAL_TABLET | Freq: Every evening | ORAL | Status: DC | PRN

## 2021-12-12 MED ORDER — SODIUM CHLORIDE 0.9 % IV SOLN: INTRAVENOUS | Status: DC

## 2021-12-12 MED ORDER — CHLORHEXIDINE GLUCONATE CLOTH 2 % EX PADS: 6.0000 | MEDICATED_PAD | Freq: Every day | CUTANEOUS | Status: DC

## 2021-12-12 MED ORDER — STROKE: EARLY STAGES OF RECOVERY BOOK: Freq: Once | Status: DC

## 2021-12-12 MED ORDER — ACETAMINOPHEN 160 MG/5ML PO SOLN: 650.0000 mg | ORAL | Status: DC | PRN

## 2021-12-12 NOTE — H&P (Addendum)
Neurology H&P  CC: left side weakness, abdominal pain  History is obtained from:Patient, record review  HPI: Terry Todd is a 36 y.o. male insomnia, asthma, obesity, GERD, HTN, Migraines, palpitations, and tachycardia who presented to [facility] with left side weakness and numbness in his left face. He was a church helping with vacation bible school when a co teacher asked him to bring her something and had a sharp abdominal pain. She is in the medical field and decided to bring him to urgent care. The urgent care physician noticed left side weakness and sent him to [facility] ED for a stroke workup. TPA was given at 19:14. Initial NIH was a 3.  He denies numbness in his extremities, tingling, or dizziness. He denies nausea, vomiting, diarrhea. He states his abdominal discomfort started about 2 weeks ago, he does not know what caused it. He has not had any changes to his diet recently. He has not been on medications for HTN for the last couple of months.  He endorses a history of migraine and takes tylenol and sumatriptan for headaches. He denies any previous episode of focal neurological deficit with his migraines. He reports that his symptoms are improved but he still feels weaker on the left.  He denies any history of seizures. Reports urinary urgency and frequency and is unable to hold urine for more than a few mins. Also reports shooting pain in his neck and upper back with neck flexion. Denies any injury o his spine.  LKW: 12/11/2021 1500 tpa given?: Yes at [facility]- 12/11/2021 at 19:14 IR Thrombectomy? No, No LVO per [facility] ED Modified Rankin Scale: 0-Completely asymptomatic and back to baseline post- stroke NIHSS: initial NIHSS 3  NIHSS components Score: Comment  1a Level of Conscious 0[x]  1[]  2[]  3[]         1b LOC Questions 0[x]  1[]  2[]           1c LOC Commands 0[x]  1[]  2[]           2 Best Gaze 0[x]  1[]  2[]           3 Visual 0[x]  1[]  2[]  3[]         4 Facial Palsy  0[x]  1[]  2[]  3[]         5a Motor Arm - left 0[]  1[x]  2[]  3[]  4[]  UN[]     5b Motor Arm - Right 0[x]  1[]  2[]  3[]  4[]  UN[]     6a Motor Leg - Left 0[]  1[x]  2[]  3[]  4[]  UN[]     6b Motor Leg - Right 0[x]  1[]  2[]  3[]  4[]  UN[]     7 Limb Ataxia 0[x]  1[]  2[]  3[]  UN[]       8 Sensory 0[]  1[x]  2[]  UN[]         9 Best Language 0[x]  1[]  2[]  3[]         10 Dysarthria 0[x]  1[]  2[]  UN[]         11 Extinct. and Inattention 0[x]  1[]  2[]           TOTAL: 3        ROS: A complete ROS was performed and is negative except as noted in the HPI.   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   Laryngopharyngeal reflux (LPR) 07/21/2019   Migraine    Migraines 01/04/2020   Migraines    Obesity (BMI 30-39.9)  01/05/2020   Palpitations 01/05/2020   Right-sided chest wall pain    Sensation of lump in throat 03/27/2018   Shortness of breath 01/05/2020   Sinus arrhythmia 01/05/2020   Tachycardia      Family History  Problem Relation Age of Onset   Cirrhosis Mother    Hepatitis C Mother    High Cholesterol Father    Kidney Stones Father    Allergies Father    Asthma Father    Asthma Brother    Migraines Brother    Breast cancer Maternal Grandmother    Diabetes Mellitus II Paternal Grandmother    Diabetes Mellitus II Paternal Grandfather    Prostate cancer Paternal Grandfather    Colon cancer Neg Hx    Esophageal cancer Neg Hx    Pancreatic cancer Neg Hx    Liver cancer Neg Hx    Stomach cancer Neg Hx     Social History:  reports that he has never smoked. He has never used smokeless tobacco. He reports that he does not drink alcohol and does not use drugs.  Prior to Admission medications   Medication Sig Start Date End Date Taking? Authorizing Provider  albuterol (VENTOLIN HFA) 108 (90 Base) MCG/ACT inhaler Inhale 2 puffs into the lungs every 4 (four)  hours as needed. 10/27/19  Yes [provider]  Budeson-Glycopyrrol-Formoterol (BREZTRI AEROSPHERE) 160-9-4.8 MCG/ACT AERO Inhale 2 puffs into the lungs in the morning and at bedtime. 08/24/21  Yes Omar Person, MD  ibuprofen (ADVIL) 600 MG tablet Take 600 mg by mouth as needed. 12/09/19  Yes [provider]  latanoprost (XALATAN) 0.005 % ophthalmic solution Place 1 drop into both eyes every evening. 08/01/21  Yes [provider]  lidocaine (LIDODERM) 5 % 1 patch daily. 11/17/21  Yes [provider]  pantoprazole (PROTONIX) 40 MG tablet Take 1 tablet (40 mg total) by mouth 2 (two) times daily. 03/01/21  Yes Armbruster, Willaim Rayas, MD  Vitamin D, Ergocalciferol, (DRISDOL) 1.25 MG (50000 UNIT) CAPS capsule Take 50,000 Units by mouth once a week. 01/16/21  Yes [provider]  Merril Abbe 113-14 MCG/ACT AEPB Inhale one puff twice daily to prevent cough or wheeze.  Rinse, gargle, and spit after use. 01/09/21   Kozlow, Alvira Philips, MD  cetirizine (ZYRTEC ALLERGY) 10 MG tablet Take 1 tablet (10 mg total) by mouth daily. 08/24/21   Omar Person, MD  Spacer/Aero-Holding Deretha Emory DEVI 1 each by Does not apply route in the morning and at bedtime. Please use with MDI inhalers 08/24/21   Omar Person, MD     Exam: Current vital signs: BP (!) 154/88   Pulse 88   Resp 11   SpO2 93%    Physical Exam  Constitutional: Appears well-developed and well-nourished.  Psych: Affect appropriate to situation Eyes: No scleral injection HENT: No OP obstrucion Head: Normocephalic.  Cardiovascular: Normal rate and regular rhythm.  Respiratory: Effort normal and breath sounds normal to anterior ascultation GI: Soft.  No distension. Abdomen tenderness with palpation over left mid and lower quadrants  Skin: WDI  Neuro: Mental Status: Patient is awake, alert, oriented to person, place, month, year, and situation. Patient is able to give a clear and coherent history. No  signs of aphasia or neglect Cranial Nerves: II: Visual Fields are full. Pupils are equal, round, and reactive to light.   III,IV, VI: EOMI without ptosis or diploplia.  V: Facial sensation hypersensitive to temperature on left face  VII: Facial movement is  symmetric resting and smiling.  VIII: Hearing is intact to voice X: Palate elevates symmetrically, phonation intact XI: Shoulder shrug is symmetric. XII: Tongue is midline without atrophy or fasciculations.  Motor: Tone is normal. Bulk is normal. 5/5 strength was present in all four extremities.  LUE 4/5 with pronator drift RUE 5/5 LLE 4/5   RLE 5/5 Sensory: Sensation is symmetric to light touch and temperature in the arms and legs. No extinction to DSS present.  Deep Tendon Reflexes: 2+ and symmetric in the biceps  1+ and symmetric in patella  Plantars: Toes are downgoing bilaterally.  Cerebellar: FNF and HKS are intact bilaterally   I have reviewed labs in epic and the pertinent results are: WBC 15.9  I have reviewed the images obtained:  CT Head- no acute abnormality CTA Head and Neck- No LVO or significant stenosis   CNS -Close neuro monitoring -MRI brain -PT/OT  RESP - continue to monitor - PRN nebs  CV - Essential (primary) hypertension - EKG - Statin for goal LDL < 70  HEME -Monitor -Transfuse for hgb < 7  ENDO -SSI -goal HgbA1c < 7%  GI/GU - Gentle hydration - avoid nephrotoxic agents - CT CAP for abdominal tenderness  - Hesitancy/urgency with urination - urecholine ordered   Fluid/Electrolyte Disorders -Replete -Trend - Daily CBC and CMP   ID -NPO -Monitor - Possible UTI -CX pending   Nutrition E66.9 Obesity    Prophylaxis DVT:  SCDs GI: PPI Bowel: Docusate / Senna  Diet: NPO until cleared by speech  Code Status: Full Code     THE FOLLOWING WERE PRESENT ON ADMISSION: CNS -  Acute Ischemic Stroke vs hemiplegic migraine. CV- Hypertension   Impression:  36 year  old male with hx of HTN, obesity who presents with L sided weakness and numbness. Was given tPA at OSH and transferred for further evaluation. Suspect that his presentation is most consistent with either a MRI negative stroke or hemiplegic migraine. He still endorses mild persistent weakness and clumsiness in LUE along with numbness in LUE. Another potential cause could be potential cervical cord pathology. He does endorse urinary urgency, frequency and at times incontinence along with lhermitte's sign on history.  Recommendations: - HgbA1c, fasting lipid panel - MRI, MRA  of the brain without contrast - Frequent neuro checks - Echocardiogram - Carotid dopplers - Prophylactic therapy-Antiplatelet med: Aspirin - dose 325mg  PO or 300mg  PR - Risk factor modification - Telemetry monitoring - PT consult, OT consult, Speech consult - Stroke team to follow -CT CAP ordered -Urinalysis and urecholine      Patient seen and examined by NP/APP with MD. MD to update note as needed.   , DNP, FNP-BC [facility] Pager: [phone]   NEUROHOSPITALIST ADDENDUM Performed a face to face diagnostic evaluation.   I have reviewed the contents of history and physical exam as documented by PA/ARNP/Resident and agree with above documentation.  I have discussed and formulated the above plan as documented. Edits to the note have been made as needed.  In addition to above, I also asked him to stop taking sumatriptan or any other triptans going forward, given concern for stroke. He should also eat healthier diet and take a multivitamin.  Elmer Picker [facility] Pager Number [phone] 12/12/2021  9:15 PM   [phone], MD [facility] 12/14/2021   If 7pm to 7am, please call on call as listed on AMION.

## 2021-12-12 NOTE — Plan of Care (Signed)
Care plan reviewed with patient.

## 2021-12-12 NOTE — Progress Notes (Signed)
Admission belongings include:  Clothes Shoes [employer] with cards and $30 cash  Cell phone 2 chargers Apple watch

## 2021-12-13 ENCOUNTER — Inpatient Hospital Stay (HOSPITAL_COMMUNITY): Payer: 59

## 2021-12-13 DIAGNOSIS — I6389 Other cerebral infarction: Secondary | ICD-10-CM

## 2021-12-13 LAB — CBC
HCT: 42 % (ref 39.0–52.0)
Hemoglobin: 13.7 g/dL (ref 13.0–17.0)
MCH: 30 pg (ref 26.0–34.0)
MCHC: 32.6 g/dL (ref 30.0–36.0)
MCV: 92.1 fL (ref 80.0–100.0)
Platelets: 291 10*3/uL (ref 150–400)
RBC: 4.56 MIL/uL (ref 4.22–5.81)
RDW: 12.7 % (ref 11.5–15.5)
WBC: 10.1 10*3/uL (ref 4.0–10.5)
nRBC: 0 % (ref 0.0–0.2)

## 2021-12-13 LAB — BASIC METABOLIC PANEL
Anion gap: 10 (ref 5–15)
BUN: 9 mg/dL (ref 6–20)
CO2: 24 mmol/L (ref 22–32)
Calcium: 9.1 mg/dL (ref 8.9–10.3)
Chloride: 105 mmol/L (ref 98–111)
Creatinine, Ser: 0.94 mg/dL (ref 0.61–1.24)
GFR, Estimated: >60 mL/min (ref >60)
Glucose, Bld: 107 mg/dL — ABNORMAL HIGH (ref 70–99)
Potassium: 4 mmol/L (ref 3.5–5.1)
Sodium: 139 mmol/L (ref 135–145)

## 2021-12-13 LAB — LIPID PANEL
Cholesterol: 149 mg/dL (ref 0–200)
HDL: 33 mg/dL — ABNORMAL LOW (ref >40)
LDL Cholesterol: 88 mg/dL (ref 0–99)
Total CHOL/HDL Ratio: 4.5 RATIO
Triglycerides: 139 mg/dL (ref <150)
VLDL: 28 mg/dL (ref 0–40)

## 2021-12-13 LAB — ECHOCARDIOGRAM COMPLETE BUBBLE STUDY
AR max vel: 3.4 cm2
AV Peak grad: 3.7 mmHg
Ao pk vel: 0.96 m/s
Area-P 1/2: 4.46 cm2
S' Lateral: 2.7 cm

## 2021-12-13 LAB — HEMOGLOBIN A1C
Hgb A1c MFr Bld: 5.1 % (ref 4.8–5.6)
Mean Plasma Glucose: 99.67 mg/dL

## 2021-12-13 LAB — HIV ANTIBODY (ROUTINE TESTING W REFLEX): HIV Screen 4th Generation wRfx: NONREACTIVE

## 2021-12-13 MED ORDER — PANTOPRAZOLE SODIUM 40 MG PO TBEC: 40.0000 mg | DELAYED_RELEASE_TABLET | Freq: Every day | ORAL | Status: DC

## 2021-12-13 MED ORDER — BUTALBITAL-APAP-CAFFEINE 50-325-40 MG PO TABS
2.0000 | ORAL_TABLET | Freq: Once | ORAL | Status: AC
  Administered 2021-12-13: 2 via ORAL
  Filled 2021-12-13: qty 2

## 2021-12-13 NOTE — Evaluation (Signed)
Physical Therapy Evaluation Patient Details Name: Terry Todd MRN: [id number] DOB: 1985/09/29 Today's Date: 12/13/2021  History of Present Illness  Pt is a 35 y.o. male who presented 12/12/21 to [facility] with L sided weakness and numbness in his L face. TPA was administered with NIH of 3. Pt transferred to [facility] 6/20. MRI of brain was unremarkable with no evidence  of acute intracranial abnormality. Unchanged C4-C5 mild left neural foraminal narrowing with no spinal canal stenosis or additional neural foraminal  narrowing noted with imaging. PMH: insomnia, asthma, obesity, GERD, HTN, Migraines, palpitations, and tachycardia   Clinical Impression  Pt presents with condition above and deficits mentioned below, see PT Problem List. PTA, he was IND without DME, living with a roommate in a 1-level apartment with a curb to enter/exit. 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 Pt reports a hx of falls when he has coughing/choking episodes (~1x/every other week, not always associated with eating/drinking), which began after having COVID. This PT communicated this concern with a SLP. Encouraged pt to check his SpO2 and BP when he has these episodes in the future. Currently, pt continues to display some mild L UE and lower extremity weakness along with slightly impaired sensation in his L lateral foot/ankle compared to his R. Despite these deficits, he was able to perform ADLs at the sink and mobilize without UE support, assistance, or LOB. However, he does ambulate at a decreased speed compared to his baseline. Educated pt on ways to increase his L sided strength at home. Will continue to follow acutely to maximize his return to baseline prior to d/c, but I anticipate pt will likely not need any further PT follow-up at d/c as he is functioning close to his baseline.     Recommendations for follow up therapy are one component of a multi-disciplinary discharge planning process,  led by the attending physician.  Recommendations may be updated based on patient status, additional functional criteria and insurance authorization.  Follow Up Recommendations No PT follow up      Assistance Recommended at Discharge None  Patient can return home with the following   (N/A)    Equipment Recommendations None recommended by PT  Recommendations for Other Services       Functional Status Assessment Patient has had a recent decline in their functional status and demonstrates the ability to make significant improvements in function in a reasonable and predictable amount of time.     Precautions / Restrictions Precautions Precautions: Other (comment) Precaution Comments: hx of falls during coughing episodes Restrictions Weight Bearing Restrictions: No      Mobility  Bed Mobility               General bed mobility comments: Pt standing in bathroom upon arrival.    Transfers Overall transfer level: Independent Equipment used: None               General transfer comment: Able to come to stand without AD or need for UE support    Ambulation/Gait Ambulation/Gait assistance: Modified independent (Device/Increase time) Gait Distance (Feet): 280 Feet (x2 bouts of ~20 ft > ~280 ft) Assistive device: None Gait Pattern/deviations: Step-through pattern, Decreased stride length Gait velocity: reduced Gait velocity interpretation: 1.31 - 2.62 ft/sec, indicative of limited community ambulator   General Gait Details: Pt with slow, but steady gait with decreased bil step length. No LOB and no significant asymmetry noted with gait, mod I.  Stairs  Wheelchair Mobility    Modified Rankin (Stroke Patients Only) Modified Rankin (Stroke Patients Only) Pre-Morbid Rankin Score: No symptoms Modified Rankin: No significant disability     Balance Overall balance assessment: No apparent balance deficits (not formally assessed)                                            Pertinent Vitals/Pain Pain Assessment Pain Assessment: Faces Faces Pain Scale: No hurt Pain Intervention(s): Monitored during session    Home Living Family/patient expects to be discharged to:: Private residence Living Arrangements: Non-relatives/Friends Available Help at Discharge: Family;Friend(s);Available 24 hours/day Type of Home: Apartment Home Access: Stairs to enter (curb) Entrance Stairs-Rails: None Entrance Stairs-Number of Steps: 1 (ramp option though)   Home Layout: One level Home Equipment: None      Prior Function Prior Level of Function : Independent/Modified Independent;Driving;Working/employed;History of Falls (last six months)             Mobility Comments: Reports hx of falls when he has coughing episodes or moments of choking on food (occurs about 1x/every other week), which he reports began after having COVID. He denied losing consciousness during these episodes. Otherwise, IND without AD. 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     Hand Dominance   Dominant Hand: Right    Extremity/Trunk Assessment   Upper Extremity Assessment Upper Extremity Assessment: Defer to OT evaluation;LUE deficits/detail LUE Deficits / Details: Generalized weakness (MMT 4- to 4 grossly) compared to R UE (grossly 5/5 MMT); denied numbness/tingling bil    Lower Extremity Assessment Lower Extremity Assessment: LLE deficits/detail LLE Deficits / Details: MMT scores of 4- hip flexion, 4+ knee extension, 4 ankle dorsiflexion (R MMT scores of 5 grossly); slightly decreased sensation L lateral ankle/foot but otherwise intact and symmetrical to R LLE Sensation: decreased light touch    Cervical / Trunk Assessment Cervical / Trunk Assessment: Normal  Communication   Communication: No difficulties  Cognition Arousal/Alertness: Awake/alert Behavior During Therapy: WFL for tasks  assessed/performed Overall Cognitive Status: Within Functional Limits for tasks assessed                                 General Comments: Very self-aware of his deficits and limitations.        General Comments General comments (skin integrity, edema, etc.): BP 153/103 sitting, 142/63 standing; encouraged pt to discuss his coughing/swallowing episodes that result in his falls with his MD or a SLP and to check his SpO2 and BP during these episodes; encouraged pt to perform L UE and L leg exercises, like lifting cans of food with L UE and doing sit to stands with L leg posterior to R to increase L sided strength    Exercises     Assessment/Plan    PT Assessment Patient needs continued PT services  PT Problem List Decreased strength;Decreased mobility;Impaired sensation       PT Treatment Interventions DME instruction;Gait training;Stair training;Functional mobility training;Therapeutic activities;Therapeutic exercise;Balance training;Neuromuscular re-education;Patient/family education    PT Goals (Current goals can be found in the Care Plan section)  Acute Rehab PT Goals Patient Stated Goal: to improve PT Goal Formulation: With patient Time For Goal Achievement: 12/20/21 Potential to Achieve Goals: Good    Frequency Min 3X/week  Co-evaluation               AM-PAC PT "6 Clicks" Mobility  Outcome Measure Help needed turning from your back to your side while in a flat bed without using bedrails?: None Help needed moving from lying on your back to sitting on the side of a flat bed without using bedrails?: None Help needed moving to and from a bed to a chair (including a wheelchair)?: None Help needed standing up from a chair using your arms (e.g., wheelchair or bedside chair)?: None Help needed to walk in hospital room?: None Help needed climbing 3-5 steps with a railing? : A Little 6 Click Score: 23    End of Session   Activity Tolerance: Patient  tolerated treatment well Patient left: Other (comment) (standing in room about to go wash his hair) Nurse Communication: Mobility status PT Visit Diagnosis: Other abnormalities of gait and mobility (R26.89);Muscle weakness (generalized) (M62.81);History of falling (Z91.81);Other symptoms and signs involving the nervous system (R29.898)    Time: 8891-6945 PT Time Calculation (min) (ACUTE ONLY): 24 min   Charges:   PT Evaluation $PT Eval Low Complexity: 1 Low PT Treatments $Therapeutic Activity: 8-22 mins        Raymond Gurney, PT, DPT Acute Rehabilitation Services  Office: [phone]   Jewel Baize 12/13/2021, 9:05 AM

## 2021-12-13 NOTE — Progress Notes (Signed)
Pt is a 36 y.o. male who presented 12/12/21 to [facility] with L sided weakness and numbness in his L face. TPA was administered with NIH of 3. Pt transferred to [facility] 6/20. MRI of brain was unremarkable with no evidence  of acute intracranial abnormality. Unchanged C4-C5 mild left neural foraminal narrowing with no spinal canal stenosis or additional neural foraminal  narrowing noted with imaging.   OT and ST recommending OP therapy, and patient agreeable.  He plans to dc home with his roommate, who can provide needed assistance at dc.  He would prefer OP therapy in [place] at [facility].  Referral faxed to [phone] for follow up.   Quintella Baton, RN, BSN  Trauma/Neuro ICU Case Manager [phone]

## 2021-12-13 NOTE — Plan of Care (Signed)
Care plan reviewed with patient.

## 2021-12-13 NOTE — Progress Notes (Addendum)
STROKE TEAM PROGRESS NOTE   INTERVAL HISTORY Patient is seen in his room with no family at the bedside.  He continues to complain of a headache as well as abdominal pain.  Yesterday, he experienced acute onset left sided weakness and numbness to the left side of his face.  He was given tPA at [facility] and then transferred here.  MRI is negative for acute stroke, and symptoms are possibly due to complicated migraine. Blood pressure adequately controlled. Currently exam is stable we will is still complains of a headache and states that this is  quite different from his usual migraines. Vitals:   12/13/21 0800 12/13/21 1151 12/13/21 1200 12/13/21 1555  BP: 118/75  133/81   Pulse: 77  64   Resp:   (!) 21   Temp: 98.2 F (36.8 C) 98.1 F (36.7 C)  98.6 F (37 C)  TempSrc: Oral Oral  Oral  SpO2: 97%  96%    CBC:  Recent Labs  Lab 12/13/21 0509  WBC 10.1  HGB 13.7  HCT 42.0  MCV 92.1  PLT [id number]   Basic Metabolic Panel:  Recent Labs  Lab 12/13/21 0509  NA 139  K 4.0  CL 105  CO2 24  GLUCOSE 107*  BUN 9  CREATININE 0.94  CALCIUM 9.1   Lipid Panel:  Recent Labs  Lab 12/13/21 0509  CHOL 149  TRIG 139  HDL 33*  CHOLHDL 4.5  VLDL 28  LDLCALC 88   HgbA1c:  Recent Labs  Lab 12/13/21 0509  HGBA1C 5.1   Urine Drug Screen:  Recent Labs  Lab 12/12/21 1737  LABOPIA NONE DETECTED  COCAINSCRNUR NONE DETECTED  LABBENZ NONE DETECTED  AMPHETMU NONE DETECTED  THCU NONE DETECTED  LABBARB NONE DETECTED    Alcohol Level No results for input(s): "ETH" in the last 168 hours.  IMAGING past 24 hours MR CERVICAL SPINE WO CONTRAST  Result Date: 12/13/2021 CLINICAL DATA:  Cervical myelopathy EXAM: MRI CERVICAL SPINE WITHOUT CONTRAST TECHNIQUE: Multiplanar, multisequence MR imaging of the cervical spine was performed. No intravenous contrast was administered. COMPARISON:  03/28/2020 MRI cervical spine FINDINGS: Evaluation is somewhat limited by motion artifact. Alignment:  Straightening and reversal of the normal cervical lordosis. No significant listhesis. Vertebrae: No acute fracture or suspicious osseous lesion. Cord: Normal signal and morphology. Posterior Fossa, vertebral arteries, paraspinal tissues: No acute finding. Redemonstrated mucous retention cyst in the sphenoid sinus. Disc levels: C2-C3: No significant disc bulge. No spinal canal stenosis or neuroforaminal narrowing. C3-C4: No significant disc bulge. No spinal canal stenosis or neuroforaminal narrowing. C4-C5: No significant disc bulge. Left uncovertebral hypertrophy. No spinal canal stenosis. Mild left neural foraminal narrowing, unchanged. C5-C6: No significant disc bulge. No spinal canal stenosis or neuroforaminal narrowing. C6-C7: Small central disc protrusion with central annular fissure. No spinal canal stenosis or neural foraminal narrowing. C7-T1: No significant disc bulge. No spinal canal stenosis or neuroforaminal narrowing. IMPRESSION: 1. Unchanged C4-C5 mild left neural foraminal narrowing. 2. No spinal canal stenosis or additional neural foraminal narrowing. Electronically Signed   By: Merilyn Baba M.D.   On: 12/13/2021 00:23   CT CHEST ABDOMEN PELVIS WO CONTRAST  Result Date: 12/12/2021 CLINICAL DATA:  Abdominal pain. EXAM: CT CHEST, ABDOMEN AND PELVIS WITHOUT CONTRAST TECHNIQUE: Multidetector CT imaging of the chest, abdomen and pelvis was performed following the standard protocol without IV contrast. RADIATION DOSE REDUCTION: This exam was performed according to the departmental dose-optimization program which includes automated exposure control, adjustment of the mA and/or kV  according to patient size and/or use of iterative reconstruction technique. COMPARISON:  CT abdomen pelvis dated 09/07/2021. FINDINGS: Evaluation of this exam is limited in the absence of intravenous contrast. CT CHEST FINDINGS Cardiovascular: There is no cardiomegaly or pericardial effusion. The thoracic aorta and central  pulmonary arteries are unremarkable on this noncontrast CT. Mediastinum/Nodes: No hilar or mediastinal adenopathy. The esophagus and the thyroid gland are grossly unremarkable. No mediastinal fluid collection. Lungs/Pleura: No focal consolidation, pleural effusion, or pneumothorax. There is a 5 mm right lower lobe nodule (86/5). A 5 mm focus of scarring versus subpleural nodule in the anterior right middle lobe (106/5). The central airways are patent. Musculoskeletal: No chest wall mass or suspicious bone lesions identified. CT ABDOMEN PELVIS FINDINGS No intra-abdominal free air or free fluid. Hepatobiliary: Fatty liver. No intrahepatic biliary dilatation. High attenuating content within the gallbladder, likely vicariously excreted contrast from recent administration. Pancreas: Unremarkable. No pancreatic ductal dilatation or surrounding inflammatory changes. Spleen: Normal in size without focal abnormality. Adrenals/Urinary Tract: The adrenal glands are unremarkable. The kidneys, visualized ureters, and urinary bladder appear unremarkable. Stomach/Bowel: There is no bowel obstruction or active inflammation. The appendix is normal. Vascular/Lymphatic: The abdominal aorta and IVC are unremarkable on this noncontrast CT. No portal venous gas. There is no adenopathy Reproductive: The prostate and seminal vesicles are grossly unremarkable. No pelvic mass. Other: None Musculoskeletal: No acute or significant osseous findings. IMPRESSION: 1. No acute intrathoracic, abdominal, or pelvic pathology. 2. Fatty liver. Electronically Signed   By: Elgie Collard M.D.   On: 12/12/2021 20:13   MR BRAIN WO CONTRAST  Result Date: 12/12/2021 CLINICAL DATA:  Provided history: Neuro deficit, acute, stroke suspected. EXAM: MRI HEAD WITHOUT CONTRAST TECHNIQUE: Multiplanar, multiecho pulse sequences of the brain and surrounding structures were obtained without intravenous contrast. COMPARISON:  Same-day brain MRI 12/12/2021. Head CT  12/11/2021. FINDINGS: Brain: Cerebral volume is normal. No cortical encephalomalacia is identified. No significant cerebral white matter disease. There is no acute infarct. No evidence of an intracranial mass. No chronic intracranial blood products. No extra-axial fluid collection. No midline shift. Vascular: Maintained flow voids within the proximal large arterial vessels. Skull and upper cervical spine: No focal suspicious marrow lesion. Sinuses/Orbits: No mass or acute finding within the imaged orbits. Small mucous retention cyst within the right maxillary sinus. 2.3 cm mucous retention cyst within the left maxillary sinus. 2.7 cm mucous retention cyst within the left sphenoid sinus. IMPRESSION: Unremarkable non-contrast MRI appearance of the brain. No evidence of acute intracranial abnormality. Paranasal sinus mucous retention cysts, as described. Electronically Signed   By: Jackey Loge D.O.   On: 12/12/2021 18:47    PHYSICAL EXAM General:  Alert, mildly obese Hispanic young patient in no acute distress Respiratory:  Regular, unlabored respirations on room air  NEURO:  Mental Status: AA&Ox3  Speech/Language: speech is without dysarthria or aphasia.  Naming, repetition, fluency, and comprehension intact.  Cranial Nerves:  II: PERRL. Visual fields full.  III, IV, VI: EOMI. Eyelids elevate symmetrically.  V: Sensation is intact to light touch and symmetrical to face.  VII: Smile is symmetrical.  VIII: hearing intact to voice. IX, X: Phonation is normal.  XII: tongue is midline without fasciculations. Motor: 5/5 strength to all muscle groups tested.  with some giveaway weakness on the left.  Orbits right over left upper extremity. Sensation- Intact to light touch bilaterally but diminished to the left leg Coordination: FTN intact bilaterally, HKS: no ataxia in BLE.Drift in LLE Gait- deferred  ASSESSMENT/PLAN Mr. Terry Todd is a 36 y.o. male with history of asthma, obesity, HTN,  migraines and palpitations presenting with acute onset left sided weakness and numbness to the left side of his face.  He was given tPA at [facility] and then transferred here.  MRI is negative for acute stroke, and symptoms are possibly due to complicated migraine.  Strokelike episode with negative MRI, possibly complicated migraine treated with IV tPA MRI  No acute intracranial abnormality 2D Echo pending LDL 88 HgbA1c 5.1 VTE prophylaxis - SCDs    Diet   Diet regular Room service appropriate? Yes; Fluid consistency: Thin   No antithrombotic prior to admission, now on No antithrombotic.  Therapy recommendations:  outpatient OT Disposition:  pending, likely home  Hypertension Home meds:  none Stable Keep BP <180/105 Long-term BP goal normotensive  Hyperlipidemia Home meds:  none LDL 88, goal < 100 High intensity statin not indicated as LDL below goal Continue statin at discharge  Migraine headaches Patient continues to complain of headache Fiorcet 2 tabs once  Other Stroke Risk Factors Migraines  Other Active Problems none  Hospital day # 1   I have personally obtained history,examined this patient, reviewed notes, independently viewed imaging studies, participated in medical decision making and plan of care.ROS completed by me personally and pertinent positives fully documented  I have made any additions or clarifications directly to the above note. Agree with note above.  Patient presented with initially abdominal pain and subsequently some left-sided weakness in the setting of a mild headache and was treated with IV tPA at outside hospital MRI is negative for acute stroke.  Given that the patient states the headache characteristics are a little bit different from his usual migraines this may represent her typical complicated migraine and strokelike episode.  Continue ongoing stroke work-up.  Mobilize out of bed.  Therapy consults.  Strict blood pressure control as per post  tPA protocol and close neurological monitoring this patient is critically ill and at significant risk of neurological worsening, death and care requires constant monitoring of vital signs, hemodynamics,respiratory and cardiac monitoring, extensive review of multiple databases, frequent neurological assessment, discussion with family, other specialists and medical decision making of high complexity.I have made any additions or clarifications directly to the above note.This critical care time does not reflect procedure time, or teaching time or supervisory time of PA/NP/Med Resident etc but could involve care discussion time.  I spent 30 minutes of neurocritical care time  in the care of  this patient.      Delia Heady, MD Medical Director [facility] Pager: [phone] 12/13/2021 4:21 PM     To contact Stroke Continuity provider, please refer to [email or web address]. After hours, contact General Neurology

## 2021-12-13 NOTE — Evaluation (Signed)
Occupational Therapy Evaluation Patient Details Name: Terry Todd MRN: [id number] DOB: 02-Feb-1986 Today's Date: 12/13/2021   History of Present Illness Pt is a 36 y.o. male who presented 12/12/21 to [facility] with L sided weakness and numbness in his L face. TPA was administered with NIH of 3. Pt transferred to [facility] 6/20. MRI of brain was unremarkable with no evidence  of acute intracranial abnormality. Unchanged C4-C5 mild left neural foraminal narrowing with no spinal canal stenosis or additional neural foraminal  narrowing noted with imaging. PMH: insomnia, asthma, obesity, GERD, HTN, Migraines, palpitations, and tachycardia   Clinical Impression   Terry Todd was evaluated s/p the above admission list, he is indep at baseline. Upon evaluation pt was mod I- supervision for all mobility and ADLs. He demonstrated functional deficits this session due to 8/10 headache, LUE weakness ans slowed coordination and impaired cognition with slower processing and decreased attention noted. He will benefit from OT acutely to address the limitation listed below. Recommend pt f/u with neuro OP OT to continue to progress the function of his LUE.     Recommendations for follow up therapy are one component of a multi-disciplinary discharge planning process, led by the attending physician.  Recommendations may be updated based on patient status, additional functional criteria and insurance authorization.   Follow Up Recommendations  Outpatient OT (Neuro OT for LUE)    Assistance Recommended at Discharge PRN  Patient can return home with the following Assist for transportation    Functional Status Assessment  Patient has had a recent decline in their functional status and demonstrates the ability to make significant improvements in function in a reasonable and predictable amount of time.  Equipment Recommendations  None recommended by OT       Precautions / Restrictions Precautions Precautions: Other  (comment) Precaution Comments: hx of falls during coughing episodes Restrictions Weight Bearing Restrictions: No      Mobility Bed Mobility Overal bed mobility: Modified Independent             General bed mobility comments: no assist needed, HOB elevated this date    Transfers Overall transfer level: Modified independent Equipment used: None                      Balance Overall balance assessment: No apparent balance deficits (not formally assessed)                                         ADL either performed or assessed with clinical judgement   ADL Overall ADL's : Needs assistance/impaired Eating/Feeding: NPO   Grooming: Set up;Sitting   Upper Body Bathing: Supervision/ safety;Sitting   Lower Body Bathing: Sit to/from stand;Supervison/ safety   Upper Body Dressing : Set up;Sitting   Lower Body Dressing: Supervision/safety;Sit to/from stand   Toilet Transfer: Supervision/safety;Ambulation   Toileting- Clothing Manipulation and Hygiene: Supervision/safety;Sitting/lateral lean       Functional mobility during ADLs: Supervision/safety General ADL Comments: Limited this session due to headache; also mildly limited due to LUE weakness and slowed coordination     Vision Baseline Vision/History: 0 No visual deficits Vision Assessment?: Vision impaired- to be further tested in functional context Additional Comments: visual testing difficult due to headache. pt endorses some new intermittent blurry vision     Perception     Praxis      Pertinent Vitals/Pain Pain Assessment Pain Assessment:  0-10 Pain Score: 8  Pain Location: head Pain Descriptors / Indicators: Headache Pain Intervention(s): Monitored during session, Limited activity within patient's tolerance     Hand Dominance Right   Extremity/Trunk Assessment Upper Extremity Assessment Upper Extremity Assessment: LUE deficits/detail LUE Deficits / Details: generally 4/5  MMT, full AROM. slow and deliberate coodination. soem mild sensation changes per pt LUE Sensation: decreased light touch LUE Coordination: decreased fine motor   Lower Extremity Assessment Lower Extremity Assessment: Defer to PT evaluation   Cervical / Trunk Assessment Cervical / Trunk Assessment: Normal   Communication Communication Communication: No difficulties   Cognition Arousal/Alertness: Awake/alert Behavior During Therapy: WFL for tasks assessed/performed Overall Cognitive Status: Within Functional Limits for tasks assessed                                 General Comments: great insight and self aware     General Comments  VSS on RA, pt with 8/10 headache    Exercises     Shoulder Instructions      Home Living Family/patient expects to be discharged to:: Private residence Living Arrangements: Non-relatives/Friends Available Help at Discharge: Family;Friend(s);Available 24 hours/day Type of Home: Apartment Home Access: Stairs to enter Entrance Stairs-Number of Steps: 1 Entrance Stairs-Rails: None Home Layout: One level     Bathroom Shower/Tub: Chief Strategy Officer: Standard     Home Equipment: None          Prior Functioning/Environment Prior Level of Function : Independent/Modified Independent;Driving;Working/employed;History of Falls (last six months)             Mobility Comments: Reports hx of falls when he has coughing episodes or moments of choking on food (occurs about 1x/every other week), which he reports began after having COVID. He denied losing consciousness during these episodes. Otherwise, IND without AD. 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        OT Problem List: Decreased strength;Impaired UE functional use      OT Treatment/Interventions: Self-care/ADL training;Therapeutic exercise;Therapeutic activities;Patient/family education;Balance training     OT Goals(Current goals can be found in the care plan section) Acute Rehab OT Goals Patient Stated Goal: back to indep OT Goal Formulation: With patient Time For Goal Achievement: 12/27/21 Potential to Achieve Goals: Good ADL Goals Pt/caregiver will Perform Home Exercise Program: Left upper extremity;With written HEP provided;Increased ROM;Increased strength Additional ADL Goal #1: Pt will indep complete all ADLs  OT Frequency: Min 2X/week    Co-evaluation              AM-PAC OT "6 Clicks" Daily Activity     Outcome Measure Help from another person eating meals?: None Help from another person taking care of personal grooming?: None Help from another person toileting, which includes using toliet, bedpan, or urinal?: None Help from another person bathing (including washing, rinsing, drying)?: None Help from another person to put on and taking off regular upper body clothing?: None Help from another person to put on and taking off regular lower body clothing?: None 6 Click Score: 24   End of Session Nurse Communication: Mobility status  Activity Tolerance: Patient tolerated treatment well Patient left: in bed;with call bell/phone within reach;with family/visitor present  OT Visit Diagnosis: Other abnormalities of gait and mobility (R26.89);Hemiplegia and hemiparesis Hemiplegia - Right/Left: Left Hemiplegia - dominant/non-dominant: Non-Dominant Hemiplegia - caused by: Unspecified  Time: [id number] OT Time Calculation (min): 15 min Charges:  OT General Charges $OT Visit: 1 Visit OT Evaluation $OT Eval Low Complexity: 1 Low   Marios Gaiser A Malli Falotico 12/13/2021, 1:52 PM

## 2021-12-13 NOTE — Evaluation (Signed)
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   Stroke-like symptom 12/12/2021   Facial pressure 09/09/2020   Sphenoid sinusitis 05/24/2020   Tachycardia    Right-sided chest wall pain    Migraine    GERD without esophagitis    Elevated transaminase level    BMI 36.0-36.9,adult    Adjustment insomnia    Sinus arrhythmia 01/05/2020   Palpitations 01/05/2020   Shortness of breath 01/05/2020   Obesity (BMI 30-39.9) 01/05/2020   Asthma 01/04/2020   Migraines 01/04/2020   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   Laryngopharyngeal reflux (LPR) 07/21/2019   Sensation of lump in throat 03/27/2018   Past Medical History:  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 male who presented to the ED with left-sided weakness and numbness of his L face. TPA was administered with NIH of 3. MRI of brain negative. Unchanged C4-C5 mild left neural foraminal narrowing with no spinal canal stenosis or additional neural foraminal  narrowing noted with imaging. PMH: insomnia, asthma, obesity, GERD, HTN, Migraines, palpitations, and tachycardia.   Assessment / Plan /  Recommendation Clinical Impression  Pt participated in speech-language-cognition evaluation. Pt denied any baseline deficits in speech, language, or cognition. 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 Pt reported that he has noticed a hoarse vocal quality and increased confusion since yesterday and that his performance with neurology's cognitive tasks was concerning. Motor speech and language skills were WNL. The [facility] Mental Status Examination was completed to evaluate the pt's cognitive-linguistic skills. He achieved a score of 23/30 which is below the normal limits of 27 or more out of 30 and is suggestive of a mild impairment. He exhibited difficulty in the areas of attention, memory, and executive function. Skilled SLP services are clinically indicated at this time to improve cognitive-linguistic function.    SLP Assessment  SLP Recommendation/Assessment: Patient needs continued Speech Lanaguage Pathology Services SLP Visit Diagnosis: Cognitive communication deficit (R41.841)    Recommendations for follow up therapy are one component of a multi-disciplinary discharge planning process, led by the attending physician.  Recommendations may be updated based on patient status, additional functional criteria and insurance authorization.    Follow Up Recommendations  Outpatient SLP    Assistance Recommended at Discharge     Functional Status Assessment Patient has had a recent decline in their functional status and demonstrates the ability to make significant improvements in function in a reasonable and predictable amount of time.  Frequency and Duration min 2x/week  2 weeks      SLP Evaluation Cognition  Overall Cognitive Status: Impaired/Different from baseline Arousal/Alertness: Awake/alert Orientation Level: Oriented X4 Year: 2023 Month: June Day  of Week: Correct Attention: Focused;Sustained Focused Attention: Appears intact Sustained Attention: Impaired Sustained Attention Impairment: Verbal complex Memory: Impaired Memory Impairment: Decreased short term memory;Retrieval deficit;Storage deficit (Immediate: 5/5; delayed: 4/5; paragraph: 4/8) Awareness: Appears intact Problem Solving: Impaired Problem Solving Impairment: Verbal complex (Money: 1/3; time: 1/1 with additional processing time) Executive Function: Reasoning;Sequencing;Organizing Reasoning: Appears intact Sequencing: Appears intact (clock: 4/4) Organizing:  (backward digit span: 2/2 with additional processing time)       Comprehension  Auditory Comprehension Overall Auditory Comprehension: Appears within functional limits for tasks assessed Yes/No Questions: Within Functional Limits Commands: Within Functional Limits Conversation: Complex    Expression Expression Primary Mode of Expression: Verbal Verbal Expression Overall Verbal Expression: Appears within functional limits for tasks assessed Initiation: No impairment Level of Generative/Spontaneous Verbalization: Conversation Repetition: No impairment Naming: No impairment Pragmatics: No impairment Written Expression Dominant Hand: Right   Oral / Motor  Oral Motor/Sensory Function Overall Oral Motor/Sensory Function: Within functional limits Motor Speech Overall Motor Speech: Appears within functional limits for tasks assessed Respiration: Within functional limits Phonation:  (mildly hoarse) Resonance: Within functional limits Articulation: Within functional limitis Intelligibility: Intelligible Motor Planning: Witnin functional limits Motor Speech Errors: Not applicable           Daquann Merriott I. Vear Clock, MS, CCC-SLP Acute Rehabilitation Services Office number [phone] Pager [phone]  Scheryl Marten 12/13/2021, 11:09 AM

## 2021-12-13 NOTE — Progress Notes (Signed)
  Transition of Care [facility]) Screening Note   Patient Details  Name: Terry Todd Date of Birth: 01/25/1986   Transition of Care [facility]) CM/SW Contact:    Mearl Latin, LCSW Phone Number: 12/13/2021, 9:25 AM    Transition of Care Department [facility]) has reviewed patient and no TOC needs have been identified at this time. We will continue to monitor patient advancement through interdisciplinary progression rounds. If new patient transition needs arise, please place a TOC consult.

## 2021-12-14 DIAGNOSIS — I6389 Other cerebral infarction: Secondary | ICD-10-CM | POA: Diagnosis not present

## 2021-12-14 NOTE — Discharge Summary (Addendum)
Stroke Discharge Summary  Patient ID: Terry Todd   MRN: [id number]      DOB: 1986-05-11  Date of Admission: 12/12/2021 Date of Discharge: 12/14/2021  Attending Physician:  Dr. Delia Heady Consultant(s):    None Patient's PCP:  Philemon Kingdom, MD  DISCHARGE DIAGNOSIS:  Stroke Like Episode which was likely a complicated migraine  Principal Problem:   Stroke (cerebrum) [facility]) Active Problems:   Stroke-like symptom   Allergies as of 12/14/2021       Reactions   Shellfish Allergy    unknown        Medication List     TAKE these medications    AirDuo Digihaler 113-14 MCG/ACT Aepb Generic drug: Fluticasone-Salmeterol(sensor) Inhale one puff twice daily to prevent cough or wheeze.  Rinse, gargle, and spit after use.   albuterol 108 (90 Base) MCG/ACT inhaler Commonly known as: VENTOLIN HFA Inhale 2 puffs into the lungs every 4 (four) hours as needed for shortness of breath.   Astepro 0.15 % Soln Generic drug: Azelastine HCl Place 1 spray into both nostrils daily as needed (For nasal congestion).   Breztri Aerosphere 160-9-4.8 MCG/ACT Aero Generic drug: Budeson-Glycopyrrol-Formoterol Inhale 2 puffs into the lungs in the morning and at bedtime.   cetirizine 10 MG tablet Commonly known as: ZyrTEC Allergy Take 1 tablet (10 mg total) by mouth daily.   ibuprofen 600 MG tablet Commonly known as: ADVIL Take 600 mg by mouth daily as needed for mild pain.   latanoprost 0.005 % ophthalmic solution Commonly known as: XALATAN Place 1 drop into both eyes every evening.   lidocaine 5 % Commonly known as: LIDODERM 1 patch daily.   pantoprazole 40 MG tablet Commonly known as: PROTONIX Take 1 tablet (40 mg total) by mouth 2 (two) times daily.   Spacer/Aero-Holding Rudean Curt 1 each by Does not apply route in the morning and at bedtime. Please use with MDI inhalers   Vitamin D (Ergocalciferol) 1.25 MG (50000 UNIT) Caps capsule Commonly known as: DRISDOL Take  50,000 Units by mouth every 7 (seven) days.        LABORATORY STUDIES CBC    Component Value Date/Time   WBC 10.1 12/13/2021 0509   RBC 4.56 12/13/2021 0509   HGB 13.7 12/13/2021 0509   HGB 14.0 01/10/2021 1030   HCT 42.0 12/13/2021 0509   HCT 41.9 01/10/2021 1030   PLT 291 12/13/2021 0509   PLT 319 01/10/2021 1030   MCV 92.1 12/13/2021 0509   MCV 89 01/10/2021 1030   MCH 30.0 12/13/2021 0509   MCHC 32.6 12/13/2021 0509   RDW 12.7 12/13/2021 0509   RDW 13.1 01/10/2021 1030   LYMPHSABS 2.6 08/24/2021 1135   LYMPHSABS 2.5 01/10/2021 1030   MONOABS 0.7 08/24/2021 1135   EOSABS 0.2 08/24/2021 1135   EOSABS 0.2 01/10/2021 1030   BASOSABS 0.0 08/24/2021 1135   BASOSABS 0.0 01/10/2021 1030   CMP    Component Value Date/Time   NA 139 12/13/2021 0509   NA 142 01/10/2021 1030   K 4.0 12/13/2021 0509   CL 105 12/13/2021 0509   CO2 24 12/13/2021 0509   GLUCOSE 107 (H) 12/13/2021 0509   BUN 9 12/13/2021 0509   BUN 8 01/10/2021 1030   CREATININE 0.94 12/13/2021 0509   CALCIUM 9.1 12/13/2021 0509   PROT 7.5 03/01/2021 1107   PROT 7.0 01/10/2021 1030   ALBUMIN 4.5 03/01/2021 1107   ALBUMIN 4.5 01/10/2021 1030   AST 24 03/01/2021 1107  ALT 51 03/01/2021 1107   ALKPHOS 55 03/01/2021 1107   BILITOT 0.6 03/01/2021 1107   BILITOT 0.5 01/10/2021 1030   GFRNONAA >60 12/13/2021 0509   GFRAA >60 12/05/2019 2202   COAGSNo results found for: "INR", "PROTIME" Lipid Panel    Component Value Date/Time   CHOL 149 12/13/2021 0509   TRIG 139 12/13/2021 0509   HDL 33 (L) 12/13/2021 0509   CHOLHDL 4.5 12/13/2021 0509   VLDL 28 12/13/2021 0509   LDLCALC 88 12/13/2021 0509   HgbA1C  Lab Results  Component Value Date   HGBA1C 5.1 12/13/2021   Urinalysis    Component Value Date/Time   COLORURINE YELLOW 12/12/2021 1737   APPEARANCEUR CLEAR 12/12/2021 1737   LABSPEC 1.016 12/12/2021 1737   PHURINE 5.0 12/12/2021 1737   GLUCOSEU NEGATIVE 12/12/2021 1737   HGBUR NEGATIVE  12/12/2021 1737   BILIRUBINUR NEGATIVE 12/12/2021 1737   KETONESUR NEGATIVE 12/12/2021 1737   PROTEINUR NEGATIVE 12/12/2021 1737   NITRITE NEGATIVE 12/12/2021 1737   LEUKOCYTESUR NEGATIVE 12/12/2021 1737   Urine Drug Screen     Component Value Date/Time   LABOPIA NONE DETECTED 12/12/2021 1737   COCAINSCRNUR NONE DETECTED 12/12/2021 1737   LABBENZ NONE DETECTED 12/12/2021 1737   AMPHETMU NONE DETECTED 12/12/2021 1737   THCU NONE DETECTED 12/12/2021 1737   LABBARB NONE DETECTED 12/12/2021 1737    Alcohol Level No results found for: "ETH"   SIGNIFICANT DIAGNOSTIC STUDIES ECHOCARDIOGRAM COMPLETE BUBBLE STUDY  Result Date: 12/13/2021    ECHOCARDIOGRAM REPORT   Patient Name:   Terry Todd Date of Exam: 12/13/2021 Medical Rec #:  [id number]       Height:       72.0 in Accession #:    [id number]      Weight:       273.0 lb Date of Birth:  Feb 24, 1986       BSA:          2.432 m Patient Age:    35 years        BP:           118/75 mmHg Patient Gender: M               HR:           77 bpm. Exam Location:  Inpatient Procedure: 2D Echo, Cardiac Doppler, Color Doppler and Saline Contrast Bubble            Study Indications:    Stroke  History:        Patient has prior history of Echocardiogram examinations, most                 recent 02/05/2020.  Sonographer:    Eduard Rouxolleen Schwartz Referring Phys: [id number] DEVON SHAFER IMPRESSIONS  1. Left ventricular ejection fraction, by estimation, is 60 to 65%. The left ventricle has normal function. The left ventricle has no regional wall motion abnormalities. There is moderate left ventricular hypertrophy. Left ventricular diastolic parameters were normal.  2. Right ventricular systolic function is normal. The right ventricular size is normal.  3. The mitral valve is normal in structure. Trivial mitral valve regurgitation. No evidence of mitral stenosis.  4. The aortic valve is normal in structure. Aortic valve regurgitation is not visualized. No aortic stenosis is  present.  5. The inferior vena cava is normal in size with greater than 50% respiratory variability, suggesting right atrial pressure of 3 mmHg.  6. Agitated saline contrast bubble study was negative, with no evidence  of any interatrial shunt. FINDINGS  Left Ventricle: Left ventricular ejection fraction, by estimation, is 60 to 65%. The left ventricle has normal function. The left ventricle has no regional wall motion abnormalities. The left ventricular internal cavity size was normal in size. There is  moderate left ventricular hypertrophy. Left ventricular diastolic parameters were normal. Right Ventricle: The right ventricular size is normal. No increase in right ventricular wall thickness. Right ventricular systolic function is normal. Left Atrium: Left atrial size was normal in size. Right Atrium: Right atrial size was normal in size. Pericardium: There is no evidence of pericardial effusion. Mitral Valve: The mitral valve is normal in structure. Trivial mitral valve regurgitation. No evidence of mitral valve stenosis. Tricuspid Valve: The tricuspid valve is normal in structure. Tricuspid valve regurgitation is not demonstrated. No evidence of tricuspid stenosis. Aortic Valve: The aortic valve is normal in structure. Aortic valve regurgitation is not visualized. No aortic stenosis is present. Aortic valve peak gradient measures 3.7 mmHg. Pulmonic Valve: The pulmonic valve was normal in structure. Pulmonic valve regurgitation is trivial. No evidence of pulmonic stenosis. Aorta: The aortic root is normal in size and structure. Ascending aorta measurements are within normal limits for age when indexed to body surface area. Venous: The inferior vena cava is normal in size with greater than 50% respiratory variability, suggesting right atrial pressure of 3 mmHg. IAS/Shunts: No atrial level shunt detected by color flow Doppler. Agitated saline contrast was given intravenously to evaluate for intracardiac shunting.  Agitated saline contrast bubble study was negative, with no evidence of any interatrial shunt.  LEFT VENTRICLE PLAX 2D LVIDd:         4.20 cm   Diastology LVIDs:         2.70 cm   LV e' medial:    8.49 cm/s LV PW:         1.50 cm   LV E/e' medial:  9.7 LV IVS:        1.40 cm   LV e' lateral:   11.40 cm/s LVOT diam:     2.20 cm   LV E/e' lateral: 7.2 LV SV:         59 LV SV Index:   24 LVOT Area:     3.80 cm  RIGHT VENTRICLE         IVC TAPSE (M-mode): 1.3 cm  IVC diam: 2.00 cm LEFT ATRIUM             Index        RIGHT ATRIUM           Index LA diam:        3.30 cm 1.36 cm/m   RA Area:     12.10 cm LA Vol (A2C):   24.0 ml 9.87 ml/m   RA Volume:   24.40 ml  10.03 ml/m LA Vol (A4C):   24.6 ml 10.12 ml/m LA Biplane Vol: 25.2 ml 10.36 ml/m  AORTIC VALVE                 PULMONIC VALVE AV Area (Vmax): 3.40 cm     PV Vmax:       0.71 m/s AV Vmax:        96.35 cm/s   PV Peak grad:  2.0 mmHg AV Peak Grad:   3.7 mmHg LVOT Vmax:      86.20 cm/s LVOT Vmean:     51.400 cm/s LVOT VTI:       0.155 m  AORTA Ao  Root diam: 3.50 cm Ao Asc diam:  3.10 cm MITRAL VALVE MV Area (PHT): 4.46 cm    SHUNTS MV Decel Time: 170 msec    Systemic VTI:  0.16 m MV E velocity: 82.30 cm/s  Systemic Diam: 2.20 cm MV A velocity: 58.70 cm/s MV E/A ratio:  1.40 Arvilla Meres MD Electronically signed by Arvilla Meres MD Signature Date/Time: 12/13/2021/6:47:47 PM    Final    MR CERVICAL SPINE WO CONTRAST  Result Date: 12/13/2021 CLINICAL DATA:  Cervical myelopathy EXAM: MRI CERVICAL SPINE WITHOUT CONTRAST TECHNIQUE: Multiplanar, multisequence MR imaging of the cervical spine was performed. No intravenous contrast was administered. COMPARISON:  03/28/2020 MRI cervical spine FINDINGS: Evaluation is somewhat limited by motion artifact. Alignment: Straightening and reversal of the normal cervical lordosis. No significant listhesis. Vertebrae: No acute fracture or suspicious osseous lesion. Cord: Normal signal and morphology. Posterior Fossa,  vertebral arteries, paraspinal tissues: No acute finding. Redemonstrated mucous retention cyst in the sphenoid sinus. Disc levels: C2-C3: No significant disc bulge. No spinal canal stenosis or neuroforaminal narrowing. C3-C4: No significant disc bulge. No spinal canal stenosis or neuroforaminal narrowing. C4-C5: No significant disc bulge. Left uncovertebral hypertrophy. No spinal canal stenosis. Mild left neural foraminal narrowing, unchanged. C5-C6: No significant disc bulge. No spinal canal stenosis or neuroforaminal narrowing. C6-C7: Small central disc protrusion with central annular fissure. No spinal canal stenosis or neural foraminal narrowing. C7-T1: No significant disc bulge. No spinal canal stenosis or neuroforaminal narrowing. IMPRESSION: 1. Unchanged C4-C5 mild left neural foraminal narrowing. 2. No spinal canal stenosis or additional neural foraminal narrowing. Electronically Signed   By: Wiliam Ke M.D.   On: 12/13/2021 00:23   CT CHEST ABDOMEN PELVIS WO CONTRAST  Result Date: 12/12/2021 CLINICAL DATA:  Abdominal pain. EXAM: CT CHEST, ABDOMEN AND PELVIS WITHOUT CONTRAST TECHNIQUE: Multidetector CT imaging of the chest, abdomen and pelvis was performed following the standard protocol without IV contrast. RADIATION DOSE REDUCTION: This exam was performed according to the departmental dose-optimization program which includes automated exposure control, adjustment of the mA and/or kV according to patient size and/or use of iterative reconstruction technique. COMPARISON:  CT abdomen pelvis dated 09/07/2021. FINDINGS: Evaluation of this exam is limited in the absence of intravenous contrast. CT CHEST FINDINGS Cardiovascular: There is no cardiomegaly or pericardial effusion. The thoracic aorta and central pulmonary arteries are unremarkable on this noncontrast CT. Mediastinum/Nodes: No hilar or mediastinal adenopathy. The esophagus and the thyroid gland are grossly unremarkable. No mediastinal fluid  collection. Lungs/Pleura: No focal consolidation, pleural effusion, or pneumothorax. There is a 5 mm right lower lobe nodule (86/5). A 5 mm focus of scarring versus subpleural nodule in the anterior right middle lobe (106/5). The central airways are patent. Musculoskeletal: No chest wall mass or suspicious bone lesions identified. CT ABDOMEN PELVIS FINDINGS No intra-abdominal free air or free fluid. Hepatobiliary: Fatty liver. No intrahepatic biliary dilatation. High attenuating content within the gallbladder, likely vicariously excreted contrast from recent administration. Pancreas: Unremarkable. No pancreatic ductal dilatation or surrounding inflammatory changes. Spleen: Normal in size without focal abnormality. Adrenals/Urinary Tract: The adrenal glands are unremarkable. The kidneys, visualized ureters, and urinary bladder appear unremarkable. Stomach/Bowel: There is no bowel obstruction or active inflammation. The appendix is normal. Vascular/Lymphatic: The abdominal aorta and IVC are unremarkable on this noncontrast CT. No portal venous gas. There is no adenopathy Reproductive: The prostate and seminal vesicles are grossly unremarkable. No pelvic mass. Other: None Musculoskeletal: No acute or significant osseous findings. IMPRESSION: 1. No acute intrathoracic, abdominal,  or pelvic pathology. 2. Fatty liver. Electronically Signed   By: Elgie Collard M.D.   On: 12/12/2021 20:13   MR BRAIN WO CONTRAST  Result Date: 12/12/2021 CLINICAL DATA:  Provided history: Neuro deficit, acute, stroke suspected. EXAM: MRI HEAD WITHOUT CONTRAST TECHNIQUE: Multiplanar, multiecho pulse sequences of the brain and surrounding structures were obtained without intravenous contrast. COMPARISON:  Same-day brain MRI 12/12/2021. Head CT 12/11/2021. FINDINGS: Brain: Cerebral volume is normal. No cortical encephalomalacia is identified. No significant cerebral white matter disease. There is no acute infarct. No evidence of an  intracranial mass. No chronic intracranial blood products. No extra-axial fluid collection. No midline shift. Vascular: Maintained flow voids within the proximal large arterial vessels. Skull and upper cervical spine: No focal suspicious marrow lesion. Sinuses/Orbits: No mass or acute finding within the imaged orbits. Small mucous retention cyst within the right maxillary sinus. 2.3 cm mucous retention cyst within the left maxillary sinus. 2.7 cm mucous retention cyst within the left sphenoid sinus. IMPRESSION: Unremarkable non-contrast MRI appearance of the brain. No evidence of acute intracranial abnormality. Paranasal sinus mucous retention cysts, as described. Electronically Signed   By: Jackey Loge D.O.   On: 12/12/2021 18:47      HISTORY OF PRESENT ILLNESS Mr. ALRICK CUBBAGE is a 36 y.o. male with history of asthma, obesity, HTN, migraines and palpitations presenting with acute onset left sided weakness and numbness to the left side of his face along with a sharp left sided abd pain. He was given tPA at [facility] and then transferred here. MRI was negative for acute stroke, and symptoms are possibly due to complicated migraine.   HOSPITAL COURSE Strokelike episode with negative MRI, possibly complicated migraine treated s/p IV tPA administration MRI  No acute intracranial abnormality 2D Echo with EF 60-65%, moderate LVH noted,  no shunt visualized.  LDL 88 HgbA1c 5.1 VTE prophylaxis - SCDs       Diet    Diet regular Room service appropriate? Yes; Fluid consistency: Thin        No antithrombotic prior to admission, now on No antithrombotic.  Therapy recommendations:  outpatient OT and ST Disposition:  Home    Hypertension Home meds:  none Stable Keep BP <180/105 Long-term BP goal normotensive   Hyperlipidemia Home meds:  none LDL 88, goal < 100 High intensity statin not indicated as LDL below goal Continue statin at discharge   Migraine headaches Patient continues to  complain of headache Fiorcet 2 tabs once Takes tylenol and sumatriptan at baseline  Denies previous complicated migraine episodes   Left sided abdominal pain CT chest, abd and pelvis wo contrast was obtained. No acute intrathoracic, abdominal, or pelvic pathology was seen. Fatty liver was noted.  Normal UA  Normal CBC Tolerating regular diet No nausea or vomiting reported    Other Stroke Risk Factors Migraines   Other Active Problems none    DISCHARGE EXAM Blood pressure 126/83, pulse (!) 59, temperature 98.5 F (36.9 C), temperature source Oral, resp. rate 19, SpO2 96 %.  PHYSICAL EXAM General:  Alert, mildly obese Hispanic young patient in no acute distress Respiratory:  Regular, unlabored respirations on room air   NEURO:  Mental Status: AA&Ox3  Speech/Language: speech is without dysarthria or aphasia.  Naming, repetition, fluency, and comprehension intact.   Cranial Nerves:  II: PERRL. Visual fields full.  III, IV, VI: EOMI. Eyelids elevate symmetrically.  V: Sensation is intact to light touch and symmetrical to face.  VII: Smile is symmetrical.  VIII: hearing intact to voice. IX, X: Phonation is normal.  XII: tongue is midline without fasciculations. Motor: 5/5 strength to all muscle groups tested.  with some giveaway weakness on the left.  Orbits right over left upper extremity. Sensation- Intact to light touch bilaterally but diminished to the left leg Coordination: FTN intact bilaterally, HKS: no ataxia in BLE.Drift in LLE Gait- deferred   Discharge Diet       Diet   Diet regular Room service appropriate? Yes; Fluid consistency: Thin   liquids  DISCHARGE PLAN Disposition:  Home Ongoing stroke risk factor control by Primary Care Physician at time of discharge Follow-up PCP Philemon Kingdom, MD within 2 weeks regarding migraine management, fatty liver, mild abd pain with negative CT imaging.  32 minutes were spent preparing discharge.  This patient was  seen and evaluated with Dr. Pearlean Brownie. He directed the plan of care.  Shon Hale, NP-C    I have personally obtained history,examined this patient, reviewed notes, independently viewed imaging studies, participated in medical decision making and plan of care.ROS completed by me personally and pertinent positives fully documented  I have made any additions or clarifications directly to the above note. Agree with note above.    Delia Heady, MD Medical Director [facility] Pager: [phone] 12/14/2021 4:39 PM

## 2021-12-14 NOTE — Progress Notes (Incomplete)
STROKE TEAM PROGRESS NOTE   INTERVAL HISTORY Patient is seen in his room with no family at the bedside.  He continues to complain of a headache as well as abdominal pain.  Yesterday, he experienced acute onset left sided weakness and numbness to the left side of his face.  He was given tPA at [facility] [place] and then transferred here.  MRI is negative for acute stroke, and symptoms are possibly due to complicated migraine. Blood pressure adequately controlled. Currently exam is stable we will is still complains of a headache and states that this is  quite different from his usual migraines. Vitals:   12/14/21 0400 12/14/21 0500 12/14/21 0600 12/14/21 0700  BP: 124/73 129/74 126/67 126/83  Pulse: (!) 57 62 (!) 56 (!) 59  Resp:   17 19  Temp: 98 F (36.7 C)     TempSrc: Oral     SpO2: 93% 96% 93% 96%   CBC:  Recent Labs  Lab 12/13/21 0509  WBC 10.1  HGB 13.7  HCT 42.0  MCV 92.1  PLT 291   Basic Metabolic Panel:  Recent Labs  Lab 12/13/21 0509  NA 139  K 4.0  CL 105  CO2 24  GLUCOSE 107*  BUN 9  CREATININE 0.94  CALCIUM 9.1   Lipid Panel:  Recent Labs  Lab 12/13/21 0509  CHOL 149  TRIG 139  HDL 33*  CHOLHDL 4.5  VLDL 28  LDLCALC 88   HgbA1c:  Recent Labs  Lab 12/13/21 0509  HGBA1C 5.1   Urine Drug Screen:  Recent Labs  Lab 12/12/21 1737  LABOPIA NONE DETECTED  COCAINSCRNUR NONE DETECTED  LABBENZ NONE DETECTED  AMPHETMU NONE DETECTED  THCU NONE DETECTED  LABBARB NONE DETECTED    Alcohol Level No results for input(s): "ETH" in the last 168 hours.  IMAGING past 24 hours ECHOCARDIOGRAM COMPLETE BUBBLE STUDY  Result Date: 12/13/2021    ECHOCARDIOGRAM REPORT   Patient Name:   Terry Todd Date of Exam: 12/13/2021 Medical Rec #:  [id number]       Height:       72.0 in Accession #:    [id number]      Weight:       273.0 lb Date of Birth:  01/17/1986       BSA:          2.432 m Patient Age:    35 years        BP:           118/75 mmHg Patient Gender: M                HR:           77 bpm. Exam Location:  Inpatient Procedure: 2D Echo, Cardiac Doppler, Color Doppler and Saline Contrast Bubble            Study Indications:    Stroke  History:        Patient has prior history of Echocardiogram examinations, most                 recent 02/05/2020.  Sonographer:    Eduard Roux Referring Phys: [id number] DEVON SHAFER IMPRESSIONS  1. Left ventricular ejection fraction, by estimation, is 60 to 65%. The left ventricle has normal function. The left ventricle has no regional wall motion abnormalities. There is moderate left ventricular hypertrophy. Left ventricular diastolic parameters were normal.  2. Right ventricular systolic function is normal. The right ventricular size is normal.  3. The  mitral valve is normal in structure. Trivial mitral valve regurgitation. No evidence of mitral stenosis.  4. The aortic valve is normal in structure. Aortic valve regurgitation is not visualized. No aortic stenosis is present.  5. The inferior vena cava is normal in size with greater than 50% respiratory variability, suggesting right atrial pressure of 3 mmHg.  6. Agitated saline contrast bubble study was negative, with no evidence of any interatrial shunt. FINDINGS  Left Ventricle: Left ventricular ejection fraction, by estimation, is 60 to 65%. The left ventricle has normal function. The left ventricle has no regional wall motion abnormalities. The left ventricular internal cavity size was normal in size. There is  moderate left ventricular hypertrophy. Left ventricular diastolic parameters were normal. Right Ventricle: The right ventricular size is normal. No increase in right ventricular wall thickness. Right ventricular systolic function is normal. Left Atrium: Left atrial size was normal in size. Right Atrium: Right atrial size was normal in size. Pericardium: There is no evidence of pericardial effusion. Mitral Valve: The mitral valve is normal in structure. Trivial mitral valve  regurgitation. No evidence of mitral valve stenosis. Tricuspid Valve: The tricuspid valve is normal in structure. Tricuspid valve regurgitation is not demonstrated. No evidence of tricuspid stenosis. Aortic Valve: The aortic valve is normal in structure. Aortic valve regurgitation is not visualized. No aortic stenosis is present. Aortic valve peak gradient measures 3.7 mmHg. Pulmonic Valve: The pulmonic valve was normal in structure. Pulmonic valve regurgitation is trivial. No evidence of pulmonic stenosis. Aorta: The aortic root is normal in size and structure. Ascending aorta measurements are within normal limits for age when indexed to body surface area. Venous: The inferior vena cava is normal in size with greater than 50% respiratory variability, suggesting right atrial pressure of 3 mmHg. IAS/Shunts: No atrial level shunt detected by color flow Doppler. Agitated saline contrast was given intravenously to evaluate for intracardiac shunting. Agitated saline contrast bubble study was negative, with no evidence of any interatrial shunt.  LEFT VENTRICLE PLAX 2D LVIDd:         4.20 cm   Diastology LVIDs:         2.70 cm   LV e' medial:    8.49 cm/s LV PW:         1.50 cm   LV E/e' medial:  9.7 LV IVS:        1.40 cm   LV e' lateral:   11.40 cm/s LVOT diam:     2.20 cm   LV E/e' lateral: 7.2 LV SV:         59 LV SV Index:   24 LVOT Area:     3.80 cm  RIGHT VENTRICLE         IVC TAPSE (M-mode): 1.3 cm  IVC diam: 2.00 cm LEFT ATRIUM             Index        RIGHT ATRIUM           Index LA diam:        3.30 cm 1.36 cm/m   RA Area:     12.10 cm LA Vol (A2C):   24.0 ml 9.87 ml/m   RA Volume:   24.40 ml  10.03 ml/m LA Vol (A4C):   24.6 ml 10.12 ml/m LA Biplane Vol: 25.2 ml 10.36 ml/m  AORTIC VALVE                 PULMONIC VALVE AV Area (Vmax): 3.40 cm  PV Vmax:       0.71 m/s AV Vmax:        96.35 cm/s   PV Peak grad:  2.0 mmHg AV Peak Grad:   3.7 mmHg LVOT Vmax:      86.20 cm/s LVOT Vmean:     51.400 cm/s LVOT  VTI:       0.155 m  AORTA Ao Root diam: 3.50 cm Ao Asc diam:  3.10 cm MITRAL VALVE MV Area (PHT): 4.46 cm    SHUNTS MV Decel Time: 170 msec    Systemic VTI:  0.16 m MV E velocity: 82.30 cm/s  Systemic Diam: 2.20 cm MV A velocity: 58.70 cm/s MV E/A ratio:  1.40 Arvilla Meres MD Electronically signed by Arvilla Meres MD Signature Date/Time: 12/13/2021/6:47:47 PM    Final     PHYSICAL EXAM Cranial Nerves:  II: PERRL. Visual fields full.  III, IV, VI: EOMI. Eyelids elevate symmetrically.  V: Sensation is intact to light touch and symmetrical to face.  VII: Smile is symmetrical.  VIII: hearing intact to voice. IX, X: Phonation is normal.  XII: tongue is midline without fasciculations. Motor: 5/5 strength to all muscle groups tested.  with some giveaway weakness on the left.  Orbits right over left upper extremity. Sensation- Intact to light touch bilaterally but diminished to the left leg Coordination: FTN intact bilaterally, HKS: no ataxia in BLE.Drift in LLE Gait- deferred  NEURO:  Mental Status: AA&Ox3  Speech/Language: speech is without dysarthria or aphasia.  Naming, repetition, fluency, and comprehension intact.  Cranial Nerves:  II: PERRL. Visual fields full.  III, IV, VI: EOMI. Eyelids elevate symmetrically.  V: Sensation is intact to light touch and symmetrical to face.  VII: Smile is symmetrical.  VIII: hearing intact to voice. IX, X: Phonation is normal.  XII: tongue is midline without fasciculations. Motor: 5/5 strength to all muscle groups tested.  with some giveaway weakness on the left.  Orbits right over left upper extremity. Sensation- Intact to light touch bilaterally but diminished to the left leg Coordination: FTN intact bilaterally, HKS: no ataxia in BLE.Drift in LLE Gait- deferred   ASSESSMENT/PLAN Mr. BRITT PETRONI is a 36 y.o. male with history of asthma, obesity, HTN, migraines and palpitations presenting with acute onset left sided weakness and numbness  to the left side of his face.  He was given tPA at [facility] and then transferred here.  MRI is negative for acute stroke, and symptoms are possibly due to complicated migraine.  Strokelike episode with negative MRI, possibly complicated migraine treated with IV tPA MRI  No acute intracranial abnormality 2D Echo pending LDL 88 HgbA1c 5.1 VTE prophylaxis - SCDs    Diet   Diet regular Room service appropriate? Yes; Fluid consistency: Thin   No antithrombotic prior to admission, now on No antithrombotic.  Therapy recommendations:  outpatient OT Disposition:  pending, likely home  Hypertension Home meds:  none Stable Keep BP <180/105 Long-term BP goal normotensive  Hyperlipidemia Home meds:  none LDL 88, goal < 100 High intensity statin not indicated as LDL below goal Continue statin at discharge  Migraine headaches Patient continues to complain of headache Fiorcet 2 tabs once  Other Stroke Risk Factors Migraines  Other Active Problems none  Hospital day # 2      To contact Stroke Continuity provider, please refer to [email or web address]. After hours, contact General Neurology

## 2021-12-18 LAB — CULTURE, BLOOD (ROUTINE X 2)
Culture: NO GROWTH
Culture: NO GROWTH
Special Requests: ADEQUATE
Special Requests: ADEQUATE

## 2022-01-01 ENCOUNTER — Telehealth: Payer: Self-pay | Admitting: Psychiatry

## 2022-01-01 NOTE — Telephone Encounter (Signed)
Rescheduled 8/4 appointment with pt over the phone - MD out

## 2022-01-12 ENCOUNTER — Ambulatory Visit (HOSPITAL_BASED_OUTPATIENT_CLINIC_OR_DEPARTMENT_OTHER): Payer: 59 | Attending: Student | Admitting: Pulmonary Disease

## 2022-01-12 DIAGNOSIS — R0683 Snoring: Secondary | ICD-10-CM | POA: Insufficient documentation

## 2022-01-12 DIAGNOSIS — R0600 Dyspnea, unspecified: Secondary | ICD-10-CM | POA: Insufficient documentation

## 2022-01-12 DIAGNOSIS — R0681 Apnea, not elsewhere classified: Secondary | ICD-10-CM | POA: Insufficient documentation

## 2022-01-15 DIAGNOSIS — R0683 Snoring: Secondary | ICD-10-CM

## 2022-01-15 NOTE — Procedures (Signed)
Patient Name: Terry Todd, Terry Todd Date: 01/12/2022 Gender: Male D.O.B: 07/29/1985 Age (years): 35 Referring Provider: Omar Person Height (inches): 71 Interpreting Physician: Cyril Mourning MD, ABSM Weight (lbs): 270 RPSGT: Cherylann Parr BMI: 38 MRN: [id number] Neck Size: 18.00 <br> <br> CLINICAL INFORMATION Sleep Study Type: NPSG    Indication for sleep study: Snoring, Witnesses Apnea / Gasping During Sleep    Epworth Sleepiness Score: 7    SLEEP STUDY TECHNIQUE As per the AASM Manual for the Scoring of Sleep and Associated Events v2.3 (April 2016) with a hypopnea requiring 4% desaturations.  The channels recorded and monitored were frontal, central and occipital EEG, electrooculogram (EOG), submentalis EMG (chin), nasal and oral airflow, thoracic and abdominal wall motion, anterior tibialis EMG, snore microphone, electrocardiogram, and pulse oximetry.  MEDICATIONS Medications self-administered by patient taken the night of the study : N/A  SLEEP ARCHITECTURE The study was initiated at 10:04:53 PM and ended at 5:08:03 AM.  Sleep onset time was 6.6 minutes and the sleep efficiency was 93.3%%. The total sleep time was 395 minutes.  Stage REM latency was 150.0 minutes.  The patient spent 1.8%% of the night in stage N1 sleep, 79.0%% in stage N2 sleep, 11.5%% in stage N3 and 7.7% in REM.  Alpha intrusion was absent.  Supine sleep was 54.31%.  RESPIRATORY PARAMETERS The overall apnea/hypopnea index (AHI) was 0.9 per hour. There were 2 total apneas, including 2 obstructive, 0 central and 0 mixed apneas. There were 4 hypopneas and 0 RERAs.  The AHI during Stage REM sleep was 0.0 per hour.  AHI while supine was 1.4 per hour.  The mean oxygen saturation was 94.0%. The minimum SpO2 during sleep was 87.0%.  snoring was noted during this study.  CARDIAC DATA The 2 lead EKG demonstrated sinus rhythm. The mean heart rate was 63.9 beats per minute. Other EKG findings  include: None.   LEG MOVEMENT DATA The total PLMS were 0 with a resulting PLMS index of 0.0. Associated arousal with leg movement index was 0.0 .  IMPRESSIONS - No significant obstructive sleep apnea occurred during this study (AHI = 0.9/h). - Mild oxygen desaturation was noted during this study (Min O2 = 87.0%). - No snoring was audible during this study. - No cardiac abnormalities were noted during this study. - Clinically significant periodic limb movements did not occur during sleep. No significant associated arousals.   DIAGNOSIS - No evidence of sleep disordered breathing   RECOMMENDATIONS - Avoid alcohol, sedatives and other CNS depressants that may worsen sleep apnea and disrupt normal sleep architecture. - Sleep hygiene should be reviewed to assess factors that may improve sleep quality. - Weight management and regular exercise should be initiated or continued if appropriate.    Cyril Mourning MD Board Certified in Sleep medicine

## 2022-01-26 ENCOUNTER — Ambulatory Visit: Payer: 59 | Admitting: Psychiatry

## 2022-01-30 ENCOUNTER — Encounter: Payer: Self-pay | Admitting: *Deleted

## 2022-02-01 ENCOUNTER — Ambulatory Visit: Payer: 59 | Admitting: Psychiatry

## 2022-02-12 NOTE — Progress Notes (Deleted)
Synopsis: Referred for moderate persistent asthma by Philemon Kingdom, MD  Subjective:   PATIENT ID: Terry Todd GENDER: male DOB: [birth year], MRN: [id number]  No chief complaint on file.  36yM with history of asthma, AR, LPR followed by Dr. Lucie Leather on singulair - no longer taking, airduo 113 1 puff BID, nasacort - daily use, protonix 40 BID. He has only ever used airduo for controller inhaler.   Had covid 07/2019 and then again in 05/2021. He is vaccinated. First course wasn't too bad. Second course went to ED. Just longstanding fever, felt like the flu.   Since second course of covid feel greater dyspnea, more fatigued overall. Over last couple of weeks as allergy season has kicked into gear has more sinonasal congestion, feels more allergy. When he was cooking recently he had episode where he felt suddenly dyspneic, with choking sensation. During night has coughing fits as well that wake him up. Wakes once weekly with these episodes.  He has seen ENT - saw Dr. Jenne Pane with [facility].   He has no family history of lung disease  He works as a Education officer, environmental. He has never lived outside of [place]. Never smoker, vaping, MJ. He has a dog, cat.   Interval HPI: He says that he's still getting choking spells in the middle of the night, wakes him. Walking up stairs he gets dyspneic, with change in voice, throat tightness.   Breztri 2 puffs twice daily, rinsing mouth after use. Not much in way of postnasal drainage/sinus congestion right now. As long as he's on protonix 40 mg takes it in the morning.   He does feel sleepy during the day. Weight has gone up since back in 2010s   Since last visit had possible complex migraine - was initially given TNK, discharged 12/14/21. No changes made to medications last visit, sleep study ordered - no significant OSA (AHI <1, desaturation to 87% nadir for 0.3% of the night).    Otherwise pertinent review of systems is negative.   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   Laryngopharyngeal reflux (LPR) 07/21/2019   Migraines 01/04/2020   Obesity (BMI 30-39.9) 01/05/2020   Palpitations 01/05/2020   Right-sided chest wall pain    Sensation of lump in throat 03/27/2018   Shortness of breath 01/05/2020   Sinus arrhythmia 01/05/2020   Tachycardia      Family History  Problem Relation Age of Onset   Cirrhosis Mother    Hepatitis C Mother    Alcoholism Mother    High Cholesterol Father    Kidney Stones Father    Allergies Father    Asthma Father    Asthma Brother    Migraines Brother    Breast cancer Maternal Grandmother    Diabetes Mellitus II Paternal Grandmother    Diabetes Mellitus II Paternal Grandfather    Prostate cancer Paternal Grandfather    Colon cancer Neg Hx    Esophageal cancer Neg Hx    Pancreatic cancer Neg Hx    Liver cancer Neg Hx    Stomach cancer Neg Hx      Past Surgical History:  Procedure Laterality Date   NO PAST SURGERIES      Social History   Socioeconomic History   Marital status: Single    Spouse name: Not on  file   Number of children: 0   Years of education: Not on file   Highest education level: Not on file  Occupational History    Comment: pastor  Tobacco Use   Smoking status: Never   Smokeless tobacco: Never  Vaping Use   Vaping Use: Never used  Substance and Sexual Activity   Alcohol use: Never   Drug use: Never   Sexual activity: Not on file  Other Topics Concern   Not on file  Social History Narrative   Not on file   Social Determinants of Health   Financial Resource Strain: Not on file  Food Insecurity: Not on file  Transportation Needs: Not on file  Physical Activity: Not on file  Stress: Not on file  Social Connections: Not on file  Intimate Partner Violence: Not on file     Allergies  Allergen Reactions    Shellfish Allergy     unknown     Outpatient Medications Prior to Visit  Medication Sig Dispense Refill   AIRDUO DIGIHALER 113-14 MCG/ACT AEPB Inhale one puff twice daily to prevent cough or wheeze.  Rinse, gargle, and spit after use. (Patient not taking: Reported on 12/12/2021) 1 each 5   albuterol (VENTOLIN HFA) 108 (90 Base) MCG/ACT inhaler Inhale 2 puffs into the lungs every 4 (four) hours as needed for shortness of breath.     Azelastine HCl (ASTEPRO) 0.15 % SOLN Place 1 spray into both nostrils daily as needed (For nasal congestion).     Budeson-Glycopyrrol-Formoterol (BREZTRI AEROSPHERE) 160-9-4.8 MCG/ACT AERO Inhale 2 puffs into the lungs in the morning and at bedtime. 5.9 g 0   cetirizine (ZYRTEC ALLERGY) 10 MG tablet Take 1 tablet (10 mg total) by mouth daily. (Patient not taking: Reported on 12/12/2021) 30 tablet 11   ibuprofen (ADVIL) 600 MG tablet Take 600 mg by mouth daily as needed for mild pain.     latanoprost (XALATAN) 0.005 % ophthalmic solution Place 1 drop into both eyes every evening.     lidocaine (LIDODERM) 5 % 1 patch daily.     pantoprazole (PROTONIX) 40 MG tablet Take 1 tablet (40 mg total) by mouth 2 (two) times daily. 60 tablet 2   Spacer/Aero-Holding Chambers DEVI 1 each by Does not apply route in the morning and at bedtime. Please use with MDI inhalers 1 each 0   Vitamin D, Ergocalciferol, (DRISDOL) 1.25 MG (50000 UNIT) CAPS capsule Take 50,000 Units by mouth every 7 (seven) days.     No facility-administered medications prior to visit.       Objective:   Physical Exam:  General appearance: 36 y.o., male, NAD, conversant  Eyes: anicteric sclerae; PERRL, tracking appropriately HENT: NCAT; MMM, mallampati 4 Neck: Trachea midline; no lymphadenopathy, no JVD Lungs: CTAB, no crackles, no wheeze, with normal respiratory effort CV: RRR, no murmur  Abdomen: Soft, non-tender; non-distended, BS present  Extremities: No peripheral edema, warm Skin: Normal  turgor and texture; no rash Psych: Appropriate affect Neuro: Alert and oriented to person and place, no focal deficit     There were no vitals filed for this visit.     on RA BMI Readings from Last 3 Encounters:  01/12/22 37.66 kg/m  10/05/21 37.03 kg/m  08/24/21 38.22 kg/m   Wt Readings from Last 3 Encounters:  01/12/22 270 lb (122.5 kg)  10/05/21 273 lb (123.8 kg)  08/24/21 274 lb (124.3 kg)     CBC    Component Value Date/Time   WBC 10.1 12/13/2021  0509   RBC 4.56 12/13/2021 0509   HGB 13.7 12/13/2021 0509   HGB 14.0 01/10/2021 1030   HCT 42.0 12/13/2021 0509   HCT 41.9 01/10/2021 1030   PLT 291 12/13/2021 0509   PLT 319 01/10/2021 1030   MCV 92.1 12/13/2021 0509   MCV 89 01/10/2021 1030   MCH 30.0 12/13/2021 0509   MCHC 32.6 12/13/2021 0509   RDW 12.7 12/13/2021 0509   RDW 13.1 01/10/2021 1030   LYMPHSABS 2.6 08/24/2021 1135   LYMPHSABS 2.5 01/10/2021 1030   MONOABS 0.7 08/24/2021 1135   EOSABS 0.2 08/24/2021 1135   EOSABS 0.2 01/10/2021 1030   BASOSABS 0.0 08/24/2021 1135   BASOSABS 0.0 01/10/2021 1030    Eos 200 IgE 101  Chest Imaging: CXR 08/25/20 reviewed by me stable relative to prior and unremarkable  CTA Chest 10/2020 reviewed by me unremarkable  Pulmonary Functions Testing Results:    Latest Ref Rng & Units 10/05/2021   11:45 AM  PFT Results  FVC-Pre L 4.22   FVC-Predicted Pre % 73   FVC-Post L 4.16   FVC-Predicted Post % 72   Pre FEV1/FVC % % 82   Post FEV1/FCV % % 86   FEV1-Pre L 3.47   FEV1-Predicted Pre % 74   FEV1-Post L 3.57   DLCO uncorrected ml/min/mmHg 31.45   DLCO UNC% % 91   DLCO corrected ml/min/mmHg 31.72   DLCO COR %Predicted % 92   DLVA Predicted % 119   TLC L 6.04   TLC % Predicted % 82   RV % Predicted % 99   Reviewed by me with nonspecific ventilatory defect, borderline mild restriction  Cleda Daub 02/06/21 normal ratio, FEV1 79%    Echocardiogram:   TTE 2021 normal      Assessment & Plan:   # Chronic  cough # Moderate persistent asthma not in exacerbation # Nonspecific ventilatory defect # AR # 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 Has in past been felt to be SCIT candidate per Dr. Lucie Leather but I can't see results of RAST testing which per pt was positive for a variety of things. Sounds like in past he has had severe GERD warranting bid PPI however EGD was normal and hasn't had opportunity to follow up with GI yet afterward. He has responded well to voice/speech therapy for component of VCD. If I had to guess what's driving most of his symptoms then I would bet that LPR/irritable larynx remain an issue for him with or without bouts of concomitant VCD and that his LPR control may be suboptimal in setting of undiagnosed sleep disordered breathing.   Plan: - continue taking zyrtec 10 mg daily for allergy season - can continue breztri 2 puffs twice daily with spacer, rinse mouth and gargle after use - if you have trouble getting with ENT/speech let us know, happy to make referral - you will be called for sleep study, pt prefers to have in-lab study - continue current reflux measures - continue nasacort - start zyrtec 10 mg daily during allergy season - see you in 4 months or sooner if any issues arise!     Omar Person, MD [facility] 02/12/2022 12:54 PM

## 2022-02-13 ENCOUNTER — Ambulatory Visit: Payer: 59 | Admitting: Student

## 2022-03-11 ENCOUNTER — Emergency Department (HOSPITAL_BASED_OUTPATIENT_CLINIC_OR_DEPARTMENT_OTHER)
Admission: EM | Admit: 2022-03-11 | Discharge: 2022-03-11 | Disposition: A | Discharge status: Home / Self Care | Payer: BC Managed Care – PPO | Attending: Emergency Medicine | Admitting: Emergency Medicine

## 2022-03-11 ENCOUNTER — Emergency Department (HOSPITAL_BASED_OUTPATIENT_CLINIC_OR_DEPARTMENT_OTHER): Payer: BC Managed Care – PPO

## 2022-03-11 DIAGNOSIS — I639 Cerebral infarction, unspecified: Secondary | ICD-10-CM | POA: Diagnosis not present

## 2022-03-11 DIAGNOSIS — K219 Gastro-esophageal reflux disease without esophagitis: Secondary | ICD-10-CM | POA: Insufficient documentation

## 2022-03-11 DIAGNOSIS — R109 Unspecified abdominal pain: Secondary | ICD-10-CM | POA: Diagnosis not present

## 2022-03-11 DIAGNOSIS — R2990 Unspecified symptoms and signs involving the nervous system: Secondary | ICD-10-CM

## 2022-03-11 DIAGNOSIS — R209 Unspecified disturbances of skin sensation: Secondary | ICD-10-CM | POA: Insufficient documentation

## 2022-03-11 LAB — DIFFERENTIAL
Abs Immature Granulocytes: 0.03 10*3/uL (ref 0.00–0.07)
Basophils Absolute: 0.1 10*3/uL (ref 0.0–0.1)
Basophils Relative: 1 %
Eosinophils Absolute: 0.3 10*3/uL (ref 0.0–0.5)
Eosinophils Relative: 3 %
Immature Granulocytes: 0 %
Lymphocytes Relative: 31 %
Lymphs Abs: 3.3 10*3/uL (ref 0.7–4.0)
Monocytes Absolute: 0.9 10*3/uL (ref 0.1–1.0)
Monocytes Relative: 9 %
Neutro Abs: 6.1 10*3/uL (ref 1.7–7.7)
Neutrophils Relative %: 56 %

## 2022-03-11 LAB — COMPREHENSIVE METABOLIC PANEL
ALT: 32 U/L (ref 0–44)
AST: 23 U/L (ref 15–41)
Albumin: 3.9 g/dL (ref 3.5–5.0)
Alkaline Phosphatase: 60 U/L (ref 38–126)
Anion gap: 7 (ref 5–15)
BUN: 13 mg/dL (ref 6–20)
CO2: 27 mmol/L (ref 22–32)
Calcium: 8.9 mg/dL (ref 8.9–10.3)
Chloride: 108 mmol/L (ref 98–111)
Creatinine, Ser: 0.91 mg/dL (ref 0.61–1.24)
GFR, Estimated: >60 mL/min (ref >60)
Glucose, Bld: 92 mg/dL (ref 70–99)
Potassium: 3.6 mmol/L (ref 3.5–5.1)
Sodium: 142 mmol/L (ref 135–145)
Total Bilirubin: 0.5 mg/dL (ref 0.3–1.2)
Total Protein: 7.1 g/dL (ref 6.5–8.1)

## 2022-03-11 LAB — CBC
HCT: 40.6 % (ref 39.0–52.0)
Hemoglobin: 13.8 g/dL (ref 13.0–17.0)
MCH: 30.1 pg (ref 26.0–34.0)
MCHC: 34 g/dL (ref 30.0–36.0)
MCV: 88.6 fL (ref 80.0–100.0)
Platelets: 247 10*3/uL (ref 150–400)
RBC: 4.58 MIL/uL (ref 4.22–5.81)
RDW: 12.4 % (ref 11.5–15.5)
WBC: 10.6 10*3/uL — ABNORMAL HIGH (ref 4.0–10.5)
nRBC: 0 % (ref 0.0–0.2)

## 2022-03-11 LAB — CBG MONITORING, ED: Glucose-Capillary: 101 mg/dL — ABNORMAL HIGH (ref 70–99)

## 2022-03-11 LAB — APTT: aPTT: 23 seconds — ABNORMAL LOW (ref 24–36)

## 2022-03-11 LAB — ETHANOL: Alcohol, Ethyl (B): <10 mg/dL (ref <10)

## 2022-03-11 LAB — PROTIME-INR
INR: 1 (ref 0.8–1.2)
Prothrombin Time: 13.1 seconds (ref 11.4–15.2)

## 2022-03-11 NOTE — ED Provider Notes (Addendum)
[facility] EMERGENCY DEPARTMENT Provider Note   CSN: [id number] Arrival date & time: 03/11/22  1504     History  Chief Complaint  Patient presents with   Numbness    Terry Todd is a 36 y.o. male.  Patient here today with concern for possible stroke.  Patient was admitted to [facility] in June of this year after receiving TNK for concerns for stroke at [facility].  MRI at [facility] was negative.  Neurology's assessment was most likely complicated migraine and patient has a history of migraine headaches.  No real headache today.  The sequence of events today is that around 1130 he had numbness in the left big toe and pain on the bottom of his foot then around 1300 had slow speech and difficulty getting words out was walking slow and vision bilaterally seem to be off.  On Wednesday he had some left-sided abdominal pain that has not occurred since.  On Friday he states that he had memory issues.  Patient was scheduled to follow-up with [facility] neurology but has not seen them yet due to insurance issues.  Past medical history is significant for asthma history of migraines history of palpitations obesity past surgical history none.  History of GERD without esophagitis.  Patient is a non-smoker.       Home Medications Prior to Admission medications   Medication Sig Start Date End Date Taking? Authorizing Provider  Merril Abbe 113-14 MCG/ACT AEPB Inhale one puff twice daily to prevent cough or wheeze.  Rinse, gargle, and spit after use. Patient not taking: Reported on 12/12/2021 01/09/21   Jessica Priest, MD  albuterol (VENTOLIN HFA) 108 (90 Base) MCG/ACT inhaler Inhale 2 puffs into the lungs every 4 (four) hours as needed for shortness of breath. 10/27/19   [provider]  Azelastine HCl (ASTEPRO) 0.15 % SOLN Place 1 spray into both nostrils daily as needed (For nasal congestion).    [provider]  Budeson-Glycopyrrol-Formoterol (BREZTRI  AEROSPHERE) 160-9-4.8 MCG/ACT AERO Inhale 2 puffs into the lungs in the morning and at bedtime. 08/24/21   Omar Person, MD  cetirizine (ZYRTEC ALLERGY) 10 MG tablet Take 1 tablet (10 mg total) by mouth daily. Patient not taking: Reported on 12/12/2021 08/24/21   Omar Person, MD  ibuprofen (ADVIL) 600 MG tablet Take 600 mg by mouth daily as needed for mild pain. 12/09/19   [provider]  latanoprost (XALATAN) 0.005 % ophthalmic solution Place 1 drop into both eyes every evening. 08/01/21   [provider]  lidocaine (LIDODERM) 5 % 1 patch daily. 11/17/21   [provider]  pantoprazole (PROTONIX) 40 MG tablet Take 1 tablet (40 mg total) by mouth 2 (two) times daily. 03/01/21   Armbruster, Willaim Rayas, MD  Spacer/Aero-Holding Deretha Emory DEVI 1 each by Does not apply route in the morning and at bedtime. Please use with MDI inhalers 08/24/21   Omar Person, MD  Vitamin D, Ergocalciferol, (DRISDOL) 1.25 MG (50000 UNIT) CAPS capsule Take 50,000 Units by mouth every 7 (seven) days. 01/16/21   [provider]      Allergies    Shellfish allergy    Review of Systems   Review of Systems  Constitutional:  Negative for chills and fever.  HENT:  Negative for ear pain and sore throat.   Eyes:  Positive for visual disturbance. Negative for photophobia and pain.  Respiratory:  Negative for cough and shortness of breath.   Cardiovascular:  Negative for  chest pain and palpitations.  Gastrointestinal:  Positive for abdominal pain and diarrhea. Negative for vomiting.  Genitourinary:  Negative for dysuria and hematuria.  Musculoskeletal:  Negative for arthralgias and back pain.  Skin:  Negative for color change and rash.  Neurological:  Positive for speech difficulty, weakness and numbness. Negative for dizziness, tremors, seizures, syncope, facial asymmetry, light-headedness and headaches.  All other systems reviewed and are negative.   Physical Exam Updated Vital  Signs BP 134/71   Pulse 77   Temp 98.1 F (36.7 C) (Oral)   Resp 18   Ht 1.803 m (5\' 11" )   Wt 123 kg   SpO2 100%   BMI 37.82 kg/m  Physical Exam Vitals and nursing note reviewed.  Constitutional:      General: He is not in acute distress.    Appearance: Normal appearance. He is well-developed. He is obese. He is not ill-appearing.  HENT:     Head: Normocephalic and atraumatic.     Mouth/Throat:     Mouth: Mucous membranes are moist.  Eyes:     Extraocular Movements: Extraocular movements intact.     Conjunctiva/sclera: Conjunctivae normal.     Pupils: Pupils are equal, round, and reactive to light.  Cardiovascular:     Rate and Rhythm: Normal rate and regular rhythm.     Heart sounds: No murmur heard. Pulmonary:     Effort: Pulmonary effort is normal. No respiratory distress.     Breath sounds: Normal breath sounds. No wheezing, rhonchi or rales.  Chest:     Chest wall: No tenderness.  Abdominal:     Palpations: Abdomen is soft.     Tenderness: There is no abdominal tenderness.  Musculoskeletal:        General: No swelling.     Cervical back: Normal range of motion and neck supple. No rigidity or tenderness.     Right lower leg: No edema.     Left lower leg: No edema.  Skin:    General: Skin is warm and dry.     Capillary Refill: Capillary refill takes less than 2 seconds.  Neurological:     General: No focal deficit present.     Mental Status: He is alert and oriented to person, place, and time.     Cranial Nerves: No cranial nerve deficit.     Sensory: No sensory deficit.     Motor: No weakness.  Psychiatric:        Mood and Affect: Mood normal.     ED Results / Procedures / Treatments   Labs (all labs ordered are listed, but only abnormal results are displayed) Labs Reviewed  APTT - Abnormal; Notable for the following components:      Result Value   aPTT 23 (*)    All other components within normal limits  CBC - Abnormal; Notable for the following  components:   WBC 10.6 (*)    All other components within normal limits  CBG MONITORING, ED - Abnormal; Notable for the following components:   Glucose-Capillary 101 (*)    All other components within normal limits  ETHANOL  PROTIME-INR  DIFFERENTIAL  COMPREHENSIVE METABOLIC PANEL  RAPID URINE DRUG SCREEN, HOSP PERFORMED  URINALYSIS, ROUTINE W REFLEX MICROSCOPIC    EKG EKG Interpretation  Date/Time:  Sunday March 11 2022 15:15:36 EDT Ventricular Rate:  87 PR Interval:  156 QRS Duration: 92 QT Interval:  346 QTC Calculation: 416 R Axis:   33 Text Interpretation: Normal sinus rhythm Indeterminate  axis Borderline ECG When compared with ECG of 05-Dec-2019 23:14, PREVIOUS ECG IS PRESENT No significant change since last tracing Confirmed by Vanetta Mulders [phone]) on 03/11/2022 3:26:13 PM  Radiology CT HEAD WO CONTRAST  Result Date: 03/11/2022 CLINICAL DATA:  Dizziness over the last 1-2 hours. Slow speech. History of CVA. EXAM: CT HEAD WITHOUT CONTRAST TECHNIQUE: Contiguous axial images were obtained from the base of the skull through the vertex without intravenous contrast. RADIATION DOSE REDUCTION: This exam was performed according to the departmental dose-optimization program which includes automated exposure control, adjustment of the mA and/or kV according to patient size and/or use of iterative reconstruction technique. COMPARISON:  12/11/2021. FINDINGS: Brain: No evidence of acute infarction, hemorrhage, hydrocephalus, extra-axial collection or mass lesion/mass effect. Vascular: No hyperdense vessel or unexpected calcification. Skull: Normal. Negative for fracture or focal lesion. Sinuses/Orbits: Globes and orbits are unremarkable. Mucous retention cysts in the maxillary and left sphenoid sinuses. Other: None. IMPRESSION: 1. No intracranial abnormality. Electronically Signed   By: Amie Portland M.D.   On: 03/11/2022 15:57    Procedures Procedures    Medications Ordered in  ED Medications - No data to display  ED Course/ Medical Decision Making/ A&P                           Medical Decision Making Amount and/or Complexity of Data Reviewed Labs: ordered. Radiology: ordered.   Symptoms somewhat atypical but the symptoms that occurred around 1300 today with the difficulty finding words and the slow speech and walking slowly vision being off could be somewhat concerning perhaps for CVA.  All of those symptoms have pretty much resolved patient still has some degree of fatigue.  Head CT negative Labs without any significant abnormalities.  Will discuss with teleneurology Dr. Amada Jupiter to see whether MRI is indicated.  At the very least patient needs follow-up with neurology.  CRITICAL CARE Performed by: Vanetta Mulders Total critical care time: 45 minutes Critical care time was exclusive of separately billable procedures and treating other patients. Critical care was necessary to treat or prevent imminent or life-threatening deterioration. Critical care was time spent personally by me on the following activities: development of treatment plan with patient and/or surrogate as well as nursing, discussions with consultants, evaluation of patient's response to treatment, examination of patient, obtaining history from patient or surrogate, ordering and performing treatments and interventions, ordering and review of laboratory studies, ordering and review of radiographic studies, pulse oximetry and re-evaluation of patient's condition.  Discussed with Dr. Amada Jupiter on-call for teleneurology.  Feels that patient does not need emergent MRI.  That he can follow-up with neurology outpatient.  In addition to patient having negative MRI back in June he had negative CT angio head and neck.  Neurology felt highly unlikely that these are stroke like symptoms.  Patient stable for discharge home.  Final Clinical Impression(s) / ED Diagnoses Final diagnoses:  Stroke-like  symptoms    Rx / DC Orders ED Discharge Orders     None         Vanetta Mulders, MD 03/11/22 1718    Vanetta Mulders, MD 03/11/22 1731

## 2022-03-11 NOTE — Discharge Instructions (Addendum)
Cleared by neurology for outpatient follow-up.  [place] neurology information provided to call on Monday to set up an appointment.  Return for any new or worse symptoms.

## 2022-03-11 NOTE — ED Triage Notes (Signed)
Pt arrives pov, steady gait, c/o LT foot numbness x 6 days. Reports dizziness x 1.5 hrs and friend reports speech was slow. . Recent hx of CVA. Speech clear, reports decreased sensation of LT side. Reports RT side HA that has resolved. EDP Zackowski at bedside

## 2022-03-22 ENCOUNTER — Other Ambulatory Visit: Payer: Self-pay

## 2022-03-22 NOTE — Patient Outreach (Signed)
  Care Coordination   03/22/2022 Name: CHEVEZ SAMBRANO MRN: [id number] DOB: [birth year]   First telephone outreach attempt to obtain mRS. No answer. Left message for returned call.  Philmore Pali [facility] Management Assistant [phone]

## 2022-03-27 ENCOUNTER — Other Ambulatory Visit: Payer: Self-pay

## 2022-03-27 NOTE — Patient Outreach (Signed)
  Care Coordination   03/27/2022 Name: Terry Todd MRN: [id number] DOB: [birth year]   Second telephone outreach attempt to obtain mRS. No answer. Left message for returned call.  Philmore Pali [facility] Management Assistant [phone]

## 2022-03-29 ENCOUNTER — Other Ambulatory Visit: Payer: Self-pay

## 2022-03-29 NOTE — Patient Outreach (Signed)
3 outreach attempts were completed to obtain mRs. mRs could not be obtained because patient never returned my calls. mRs=7    Andretta Ergle Care Management Assistant [phone]  

## 2022-04-11 ENCOUNTER — Encounter: Payer: Self-pay | Admitting: Neurology

## 2022-04-11 ENCOUNTER — Ambulatory Visit: Payer: BC Managed Care – PPO | Admitting: Neurology

## 2022-04-11 VITALS — BP 152/92 | HR 83 | Ht 71.0 in | Wt 263.0 lb

## 2022-04-11 DIAGNOSIS — G43009 Migraine without aura, not intractable, without status migrainosus: Secondary | ICD-10-CM

## 2022-04-11 DIAGNOSIS — D6859 Other primary thrombophilia: Secondary | ICD-10-CM | POA: Diagnosis not present

## 2022-04-11 DIAGNOSIS — G459 Transient cerebral ischemic attack, unspecified: Secondary | ICD-10-CM

## 2022-04-11 NOTE — Patient Instructions (Addendum)
Hypercoagulable panel for genetic causes of clots 30-day heart monitor looking for atrial fibrillation and then we may proceed to a TEE (Transesophageal echo) and loop recorder Trans Cranial Doppler looking for a patent foramen ovale After this may do even more workup  Baby aspirin 81mg   Ischemic Stroke  An ischemic stroke (cerebrovascular accident, CVA) occurs when an area of the brain does not get enough blood flow. This leads to the sudden death of brain tissue and can cause brain damage. An ischemic stroke is a medical emergency. It must be treated right away. What are the causes? This condition is caused by a decrease of blood flow to a part of the brain. This may be due to: A small blood clot (embolus). A buildup of plaque in the blood vessels (atherosclerosis). This blocks blood flow in the brain. An abnormal heart rhythm, called atrial fibrillation (AFib). This sends a small blood clot to the brain. A blocked or damaged artery in the head or neck. Certain infections. Inflammation of the arteries in the brain (vasculitis). Sometimes, the cause of ischemic stroke is not known. What increases the risk? The following medical conditions may increase your risk of a stroke: High blood pressure (hypertension). Heart disease. Diabetes. High cholesterol. Obesity. Sleep problems (sleep apnea). Other risk factors that you can change include: Using products that contain nicotine or tobacco. Not being active. Heavy use of alcohol or drugs, especially cocaine and methamphetamine. Taking birth control pills, especially if you also use tobacco. Risk factors that you cannot change include: Being older than age 30. Having a history of blood clots, stroke, or mini-stroke (transient ischemic attack, TIA). Having a family history of stroke. What are the signs or symptoms? Symptoms of this condition usually develop suddenly, or you may notice them after waking from sleep. These may  include: Weakness or numbness of your face, arm, or leg, especially on one side of your body. Loss of balance or coordination. Slurred speech, trouble speaking, trouble understanding speech, or a combination of these. Vision changes. You may have double vision, blurred vision, or loss of vision. Dizziness or confusion. Nausea and vomiting. Severe headache. If possible, write down the exact time your symptoms started. Tell your health care provider. If symptoms come and go, they could be signs of a TIA. Get help right away, even if you feel better. How is this diagnosed? This condition may be diagnosed based on: Your symptoms, your medical history, and a physical exam. CT scan of the brain. MRI. Imaging tests that scan blood flow in the brain (CT angiogram, MRI angiogram, or cerebral angiogram). You may also have other tests, including: Electrocardiogram (ECG). Continuous heart monitoring. Transthoracic echocardiogram (TTE). Transesophageal echocardiogram (TEE). Carotid ultrasound. Blood tests. Sleep study to check for sleep apnea. You may need to see a health care provider who specializes in stroke care. How is this treated? Treatment for this condition depends on the area of the brain affected and the cause of your symptoms. Some treatments work better if they are done within 3-6 hours of the start of symptoms. These may include: Medicine that is injected to dissolve the blood clot. Treatments given directly to the affected artery to remove or dissolve the blood clot. Medicines to control blood pressure. Medicines to thin the blood (anticoagulant or antiplatelet). Other treatments may include: Oxygen. IV fluids. Procedures to increase blood flow. After a stroke, you may work with physical, speech, mental health, or occupational therapists to help you recover. Follow these instructions at home:  Medicines Take over-the-counter and prescription medicines only as told by your  health care provider. If you were told to take a medicine to thin your blood, take it exactly as told, at the same time every day. Taking too much of a blood thinner can cause bleeding. Taking too little may not protect you against a stroke and other problems. If you were not prescribed medicines that contain aspirin or NSAIDs, such as ibuprofen, talk with your health care provider before you take any of these. These medicines increase your risk for dangerous bleeding. When taking a blood thinner, do these things: Hold pressure over any cuts that bleed for longer than usual. Tell your dentist and other health care providers that you are taking anticoagulants before having any procedures that may cause bleeding. Avoid activities that could cause injury or bruising. Wear a medical alert bracelet or carry a card that lists what medicines you take. Eating and drinking Follow instructions from your health care provider about diet. Eat healthy foods. If your stroke affected your ability to swallow, you may need to take steps to avoid choking. These may include: Taking small bites of food. Eating soft or pureed foods. Safety Follow instructions from your health care team about physical activity. Use a walker or cane as told by your health care provider. Take steps to lower your risk of falls at home. These may include: Installing grab bars in the bedroom and bathroom. Using raised toilets and putting a seat in the shower. Removing clutter and tripping hazards, such as cords or area rugs. General instructions Do not use any products that contain nicotine or tobacco. These include cigarettes, chewing tobacco, and vaping devices, such as e-cigarettes. If you need help quitting, ask your health care provider. If you drink alcohol: Limit how much you have to: 0-1 drink a day for women who are not pregnant. 0-2 drinks a day for men. Know how much alcohol is in your drink. In the [place] one drink equals  one 12 oz bottle of beer (355 mL), one 5 oz glass of wine (148 mL), or one 1 oz glass of hard liquor (44 mL). Keep all follow-up visits. This is important. How is this prevented? You can lower your risk of another stroke by managing these conditions: High blood pressure. High cholesterol. Diabetes. Heart disease. Sleep apnea. Obesity. Quitting smoking, limiting alcohol, and staying physically active also will reduce your risk. Your health care provider will continue to help you with ways to prevent short-term and long-term problems caused by stroke. Get help right away if: You have any symptoms of a stroke. "BE FAST" is an easy way to remember the main warning signs of a stroke: B - Balance. Signs are dizziness, sudden trouble walking, or loss of balance. E - Eyes. Signs are trouble seeing or a sudden change in vision. F - Face. Signs are sudden weakness or numbness of the face, or the face or eyelid drooping on one side. A - Arms. Signs are weakness or numbness in an arm. This happens suddenly and usually on one side of the body. S - Speech. Signs are sudden trouble speaking, slurred speech, or trouble understanding what people say. T - Time. Time to call emergency services. Write down what time symptoms started. You have other signs of a stroke, such as: A sudden, severe headache with no known cause. Nausea or vomiting. Seizure. These symptoms may represent a serious problem that is an emergency. Do not wait to see if the  symptoms will go away. Get medical help right away. Call your local emergency services (911 in the [place] Do not drive yourself to the hospital. Summary An ischemic stroke (cerebrovascular accident, CVA) occurs when an area of the brain does not get enough blood flow. Symptoms of this condition usually develop suddenly, or you may notice them after waking from sleep. It is very important to get treatment at the first sign of stroke symptoms. Stroke is a medical  emergency that must be treated right away. This information is not intended to replace advice given to you by your health care provider. Make sure you discuss any questions you have with your health care provider. Document Revised: 01/20/2020 Document Reviewed: 01/20/2020 Elsevier Patient Education  2022 [place], Adult  A foramen ovale is a hole between the upper chambers (right atrium and left atrium) of the heart. Before you are born, it is normal to have this hole in your heart. The hole allows blood to circulate through the body without having to go through the lungs. After your birth, when you are able to breathe, you do not need the foramen ovale and it usually closes. If the hole does not close, it is called a patent foramen ovale (PFO). PFO is a common condition. Most people do not know they have this hole, and they do not have any health problems caused by it. What are the causes? The cause of this condition is not known. What are the signs or symptoms? In most cases, there are no symptoms of this condition. Possible rare symptoms include: Stroke caused by a blood clot. Migraine headaches. Platypnea-orthodeoxia syndrome. This is a condition in which a person has shortness of breath and decreased oxygen when seated or standing but feels better when lying down. How is this diagnosed? This condition may be diagnosed based on: A physical exam and your medical history. Echocardiogram. This test uses sound waves to produce images of the heart. Transesophageal echocardiogram (TEE). This type of echocardiogram is performed by placing a probe in the part of the body that moves food from the mouth to the stomach (esophagus). Electrocardiogram (ECG). This test identifies changes in the electrical activity of the heart. Cardiac MRI. This is an imaging technique that is used to visualize the heart, if further images are needed after TEE. How is this treated? Usually,  no treatment is needed. If your condition is associated with symptoms or blood clots, you may need: Medicines to prevent blood clots and strokes (anticoagulant or antiplateletmedicines). A surgical procedure to close the hole (transcatheter closure). Follow these instructions at home: Take over-the-counter and prescription medicines only as told by your health care provider. Keep all follow-up visits. This is important. Contact a health care provider if: You have a fever. You have frequent or severe headaches. Get help right away if: Your skin turns blue. You have chest pain or difficulty breathing. You have any symptoms of stroke. "BE FAST" is an easy way to remember the main warning signs of stroke: B - Balance. Signs are dizziness, sudden trouble walking, or loss of balance. E - Eyes. Signs are trouble seeing or a sudden change in vision. F - Face. Signs are sudden weakness or numbness of the face, or the face or eyelid drooping on one side. A - Arms. Signs are weakness or numbness in an arm. This happens suddenly and usually on one side of the body. S - Speech. Signs are sudden trouble speaking, slurred speech,  or trouble understanding what people say. T - Time. Time to call emergency services. Write down what time symptoms started. You have other signs of a stroke, such as: A sudden, severe headache with no known cause. Nausea or vomiting. Seizure. These symptoms may represent a serious problem that is an emergency. Do not wait to see if the symptoms will go away. Get medical help right away. Call your local emergency services (911 in the [place] Do not drive yourself to the hospital. Summary A patent foramen ovale is a hole between the upper chambers (right atrium and left atrium) of your heart. The cause of this condition is not known. You may not know that you have a hole in your heart, and you may not have any health problems from it. Usually, no treatment is needed for this  condition unless you have symptoms or blood clots. This information is not intended to replace advice given to you by your health care provider. Make sure you discuss any questions you have with your health care provider. Document Revised: 04/14/2020 Document Reviewed: 04/14/2020 Elsevier Patient Education  2023 [employer].

## 2022-04-11 NOTE — Progress Notes (Unsigned)
[facility]    Provider:  Dr Lucia Gaskins Requesting Provider: Philemon Kingdom, MD Primary Care Provider:  Philemon Kingdom, MD  CC:  Stroke-like episode  HPI:  Terry Todd is a 36 y.o. male here as requested by Philemon Kingdom, MD for "complicated migraine". PMHx "stroke-like episode" and "complicated migraines", asthma, obesity, GERD, hypertension, palpitations and tachycardia who presented to [facility] with left-sided weakness and numbness in his left face.  NIH was 3 and he was given tPA.  Patient is here alone and he reports he has had migraines most of his life but NEVER AN AURA, going back to middle school, mother had migraines, he has tried shots and everything growing up, in 2017 it started worsening, he has photophobia/phonophobia, nausea, pulsating/pounding/throbbing and always on the right side at the temple and can be moderate to severe can have a lot muscular pain. Moderate to severe. If treated it could last 1-2 hours otherwise could last 24-48 hours with pain. Prior to June 19th never had an aura. Usual frequency was 1-2 a week(6 total migraines a month with < 10 total headache days a month) . June 19th he was at church and they were in the building and he got some pain on the left side of his rib cage, kept getting worse, then he started feeling odd, felt weak walking left leg and arm and he sat down and his left arm started going numb. A church member felt concerned, he had sensory changes, went to urgent care and had a stroke code at the hospital. He was having a problem answering questions, confusion, never had a heaache during the whole time and no hx of auras. Sleep apnea test was negative. No other focal neurologic deficits, associated symptoms, inciting events or modifiable factors.  Reviewed notes, labs and imaging from outside physicians, which showed:  CLINICAL DATA:  Provided history: Neuro deficit, acute, stroke suspected.   EXAM: MRI  HEAD WITHOUT CONTRAST   TECHNIQUE: Multiplanar, multiecho pulse sequences of the brain and surrounding structures were obtained without intravenous contrast.   COMPARISON:  Same-day brain MRI 12/12/2021. Head CT 12/11/2021.   FINDINGS: Brain:   Cerebral volume is normal.   No cortical encephalomalacia is identified. No significant cerebral white matter disease.   There is no acute infarct.   No evidence of an intracranial mass.   No chronic intracranial blood products.   No extra-axial fluid collection.   No midline shift.   Vascular: Maintained flow voids within the proximal large arterial vessels.   Skull and upper cervical spine: No focal suspicious marrow lesion.   Sinuses/Orbits: No mass or acute finding within the imaged orbits. Small mucous retention cyst within the right maxillary sinus. 2.3 cm mucous retention cyst within the left maxillary sinus. 2.7 cm mucous retention cyst within the left sphenoid sinus.   IMPRESSION: Unremarkable non-contrast MRI appearance of the brain. No evidence of acute intracranial abnormality.   Paranasal sinus mucous retention cysts, as described.  I reviewed notes from the hospital, [facility], he presented to [facility] and was transferred to [facility], he had left-sided weakness and numbness in his left face, he was at church helping with vacation [employer] when a COVID teacher asked him to bring something negative sharp abdominal pain and they brought him to medical urgent care, the urgent care doctor noticed left-sided weakness and sent him to [facility] ED for stroke work-up, tPA was given at 1914 and show NIH was a 3, and he went  to [facility].  He endorsed a history of migraines but no previous history of aura or focal neurologic deficits with his migraines, he still felt some weakness but denied any sensory changes.  From a thorough review of records, medications tried that can be used in headache management  include: Fioricet, depakote, lexapro, robaxin, propranolol, amitriptyline, topamax, triptans contraindicated due to possible TIA, maxalt, imitrex oral and Injectable  Review of Systems: Patient complains of symptoms per HPI as well as the following symptoms migraines. Pertinent negatives and positives per HPI. All others negative.   Social History   Socioeconomic History   Marital status: Single    Spouse name: Not on file   Number of children: 0   Years of education: Not on file   Highest education level: Not on file  Occupational History    Comment: pastor  Tobacco Use   Smoking status: Never   Smokeless tobacco: Never  Vaping Use   Vaping Use: Never used  Substance and Sexual Activity   Alcohol use: Never   Drug use: Never   Sexual activity: Not on file  Other Topics Concern   Not on file  Social History Narrative   Lives with roommate   Right handed   Caffeine: 1-2 cups/day   Social Determinants of Health   Financial Resource Strain: Not on file  Food Insecurity: Not on file  Transportation Needs: Not on file  Physical Activity: Not on file  Stress: Not on file  Social Connections: Not on file  Intimate Partner Violence: Not on file    Family History  Problem Relation Age of Onset   Cirrhosis Mother    Hepatitis C Mother    Alcoholism Mother    High Cholesterol Father    Kidney Stones Father    Allergies Father    Asthma Father    Asthma Brother    Migraines Brother    Breast cancer Maternal Grandmother    Diabetes Mellitus II Paternal Grandmother    Diabetes Mellitus II Paternal Grandfather    Prostate cancer Paternal Grandfather    Colon cancer Neg Hx    Esophageal cancer Neg Hx    Pancreatic cancer Neg Hx    Liver cancer Neg Hx    Stomach cancer Neg Hx     Past Medical History:  Diagnosis Date   Adjustment insomnia    Asthma    BMI 36.0-36.9,adult    Elevated transaminase level    GERD without esophagitis    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   Laryngopharyngeal reflux (LPR) 07/21/2019   Migraines 01/04/2020   Obesity (BMI 30-39.9) 01/05/2020   Palpitations 01/05/2020   Right-sided chest wall pain    Sensation of lump in throat 03/27/2018   Shortness of breath 01/05/2020   Sinus arrhythmia 01/05/2020   Tachycardia     Patient Active Problem List   Diagnosis Date Noted   TIA (transient ischemic attack) 04/12/2022   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   Stroke-like symptom 12/12/2021   Facial pressure 09/09/2020   Sphenoid sinusitis 05/24/2020   Tachycardia    Right-sided chest wall pain    Migraine    GERD without esophagitis    Elevated transaminase level    BMI 36.0-36.9,adult    Adjustment insomnia    Sinus arrhythmia 01/05/2020   Palpitations 01/05/2020   Shortness of breath 01/05/2020   Obesity (BMI 30-39.9) 01/05/2020   Asthma 01/04/2020   Migraines 01/04/2020   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     Azelastine HCl (ASTEPRO) 0.15 % SOLN Place 1 spray into both nostrils daily as needed (For nasal congestion).     Budeson-Glycopyrrol-Formoterol (BREZTRI AEROSPHERE) 160-9-4.8 MCG/ACT AERO Inhale 2 puffs into the lungs in the morning and at bedtime. 5.9 g 0   cetirizine (ZYRTEC ALLERGY) 10 MG tablet Take 1 tablet (10 mg total) by mouth daily. 30 tablet 11   divalproex (DEPAKOTE ER) 250 MG 24 hr tablet Take 250 mg by mouth at bedtime.     ibuprofen (ADVIL) 600 MG tablet Take 600 mg by mouth daily as needed for mild pain.     latanoprost (XALATAN) 0.005 % ophthalmic solution Place 1 drop into both eyes every evening.     lidocaine  (LIDODERM) 5 % 1 patch daily.     omeprazole (PRILOSEC) 20 MG capsule Take 20 mg by mouth daily.     Spacer/Aero-Holding Chambers DEVI 1 each by Does not apply route in the morning and at bedtime. Please use with MDI inhalers 1 each 0   Ubrogepant (UBRELVY) 100 MG TABS Take 100 mg by mouth every 2 (two) hours as needed. Maximum 200mg  a day. 16 tablet 11   Vitamin D, Ergocalciferol, (DRISDOL) 1.25 MG (50000 UNIT) CAPS capsule Take 50,000 Units by mouth every 7 (Terry) days.     No current facility-administered medications for this visit.    Allergies as of 04/11/2022 - Review Complete 04/11/2022  Allergen Reaction Noted   Shellfish allergy  12/12/2021    Vitals: BP (!) 152/92 (BP Location: Right Arm, Patient Position: Sitting, Cuff Size: Large)   Pulse 83   Ht 5\' 11"  (1.803 m)   Wt 263 lb (119.3 kg)   BMI 36.68 kg/m  Last Weight:  Wt Readings from Last 1 Encounters:  04/11/22 263 lb (119.3 kg)   Last Height:   Ht Readings from Last 1 Encounters:  04/11/22 5\' 11"  (1.803 m)     Physical exam: Exam: Gen: NAD, conversant, well nourised, obese, well groomed                     CV: RRR, no MRG. No Carotid Bruits. No peripheral edema, warm, nontender Eyes: Conjunctivae clear without exudates or hemorrhage  Neuro: Detailed Neurologic Exam  Speech:    Speech is normal; fluent and spontaneous with normal comprehension.  Cognition:    The patient is oriented to person, place, and time;     recent and remote memory intact;     language fluent;     normal attention, concentration,     fund of knowledge Cranial Nerves:    The pupils are equal, round, and reactive to light. The fundi are normal and spontaneous venous pulsations are present. Visual fields are full to finger confrontation. Extraocular movements are intact. Trigeminal sensation is intact and the muscles of mastication are normal. The face is symmetric. The palate elevates in the midline. Hearing intact. Voice is normal.  Shoulder shrug is normal. The tongue has normal motion without fasciculations.   Coordination:    Normal finger to nose and heel to shin.  Gait:  normal.   Motor Observation:    No asymmetry, no atrophy, and no involuntary movements noted. Tone:    Normal muscle tone.    Posture:    Posture is normal. normal erect    Strength:    Strength is V/V in the upper and lower limbs.      Sensation: intact to LT     Reflex Exam:  DTR's:    Deep tendon reflexes in the upper and lower extremities are normal bilaterally.   Toes:    The toes are downgoing bilaterally.   Clonus:    Clonus is absent.    Assessment/Plan: 36 y.o. male here as requested by Philemon Kingdom, MD for "complicated migraine". PMHx "stroke-like episode" and "complicated migraines", asthma, obesity, GERD, hypertension, palpitations and tachycardia who presented to [facility] with left-sided weakness and numbness in his left face.  NIH was 3 and he was given tPA.  Patient is here alone and he reports he has had migraines most of his life but NEVER AN AURA.  - I would consider what happened to this patient very unusual, he may have migraines but has never had an aura and had this episode without a migraine. He needs to be more thoroughly evaluated for TIA especially at his age.  - Hypercoagulable panel for genetic causes of clots - 30-day heart monitor looking for atrial fibrillation and then we may proceed to a TEE (Transesophageal echo) and loop recorder - Trans Cranial Doppler with bubbles looking for a patent foramen ovale - After this may do even more workup, TEE or loop - Baby aspirin 81mg  daily Triptans contraindicated, try [facility] course: MRI showed no acute intracranial abnormality, 2D echo with normal ejection fraction but moderate left ventricular hypertrophy noted (he needs to follow-up with primary care for this and possibly blood pressure control), LDL was 88, hemoglobin A1c  5.1.   Orders Placed This Encounter  Procedures   Cardiolipin antibodies, IgG, IgM, IgA   Prothrombin gene mutation   Factor 5 leiden   Homocysteine   Beta-2-glycoprotein i abs, IgG/M/A   Lupus anticoagulant   Protein S, total   Protein S activity   Protein C activity   Protein C, total   Antithrombin III   CARDIAC EVENT MONITOR   VAS [facility] TRANSCRANIAL DOPPLER W BUBBLES   Meds ordered this encounter  Medications   Ubrogepant (UBRELVY) 100 MG TABS    Sig: Take 100 mg by mouth every 2 (two) hours as needed. Maximum 200mg  a day.    Dispense:  16 tablet    Refill:  11    6 total migraines a month with < 10 total headache days a month). Failed imitrex and maxalt. Triptans now contraindicated due to TIA.    Cc: [place], MD,  , MD  Philemon Kingdom, MD  [facility] [address] [place], [address] [place]  Phone [phone] Fax [phone]  I spent over 60 minutes of face-to-face and non-face-to-face time with patient on the  1. TIA (transient ischemic attack)   2. Migraine without aura and without status migrainosus, not intractable   3. screen for Hypercoagulable state (HCC)    diagnosis.  This included previsit chart review, lab review, study review, order entry, electronic health record documentation, patient education on the different diagnostic and therapeutic options, counseling and coordination of care, risks and benefits of management, compliance, or risk factor reduction

## 2022-04-12 ENCOUNTER — Other Ambulatory Visit: Payer: Self-pay | Admitting: Neurology

## 2022-04-12 ENCOUNTER — Encounter: Payer: Self-pay | Admitting: Neurology

## 2022-04-12 DIAGNOSIS — R002 Palpitations: Secondary | ICD-10-CM

## 2022-04-12 DIAGNOSIS — G459 Transient cerebral ischemic attack, unspecified: Secondary | ICD-10-CM | POA: Insufficient documentation

## 2022-04-12 DIAGNOSIS — R000 Tachycardia, unspecified: Secondary | ICD-10-CM

## 2022-04-12 DIAGNOSIS — D6859 Other primary thrombophilia: Secondary | ICD-10-CM

## 2022-04-12 MED ORDER — UBRELVY 100 MG PO TABS: 100.0000 mg | ORAL_TABLET | ORAL | 11 refills | Status: AC | PRN

## 2022-04-16 LAB — ANTITHROMBIN III: AntiThromb III Func: 110 % (ref 75–135)

## 2022-04-16 LAB — BETA-2-GLYCOPROTEIN I ABS, IGG/M/A
Beta-2 Glyco 1 IgA: <9 GPI IgA units (ref 0–25)
Beta-2 Glyco 1 IgM: <9 GPI IgM units (ref 0–32)
Beta-2 Glyco I IgG: <9 GPI IgG units (ref 0–20)

## 2022-04-16 LAB — PROTHROMBIN GENE MUTATION

## 2022-04-16 LAB — LUPUS ANTICOAGULANT
Dilute Viper Venom Time: 40 s (ref 0.0–47.0)
PTT Lupus Anticoagulant: 31 s (ref 0.0–43.5)
Thrombin Time: 19.2 s (ref 0.0–23.0)
dPT Confirm Ratio: 1.13 Ratio (ref 0.00–1.34)
dPT: 39.2 s (ref 0.0–47.6)

## 2022-04-16 LAB — FACTOR 5 LEIDEN

## 2022-04-16 LAB — PROTEIN S ACTIVITY: Protein S Activity: 119 % (ref 63–140)

## 2022-04-16 LAB — PROTEIN C, TOTAL: Protein C Antigen: 117 % (ref 60–150)

## 2022-04-16 LAB — PROTEIN S, TOTAL: Protein S Ag, Total: 90 % (ref 60–150)

## 2022-04-16 LAB — HOMOCYSTEINE: Homocysteine: 12 umol/L (ref 0.0–14.5)

## 2022-04-16 LAB — PROTEIN C ACTIVITY: Protein C Activity: 152 % (ref 73–180)

## 2022-04-17 ENCOUNTER — Telehealth: Payer: Self-pay

## 2022-04-17 NOTE — Telephone Encounter (Signed)
PA submitted via CMM Key: [id number] Your information has been submitted to [place]. If [employer] has not responded to your request within 24 hours, contact [place] at [phone].

## 2022-04-18 NOTE — Telephone Encounter (Signed)
Roselyn Meier approved 04/17/2022 - 04/18/2023. I have faxed the approval letter to the pt's pharmacy and notified the patient. Received a receipt of confirmation.   PA# [id number]

## 2022-04-22 ENCOUNTER — Ambulatory Visit: Payer: BC Managed Care – PPO | Attending: Neurology

## 2022-04-22 DIAGNOSIS — D6859 Other primary thrombophilia: Secondary | ICD-10-CM | POA: Diagnosis not present

## 2022-04-22 DIAGNOSIS — R002 Palpitations: Secondary | ICD-10-CM

## 2022-04-22 DIAGNOSIS — R000 Tachycardia, unspecified: Secondary | ICD-10-CM | POA: Diagnosis not present

## 2022-04-22 DIAGNOSIS — G459 Transient cerebral ischemic attack, unspecified: Secondary | ICD-10-CM

## 2022-04-23 ENCOUNTER — Encounter: Payer: Self-pay | Admitting: Neurology

## 2022-05-03 ENCOUNTER — Ambulatory Visit (HOSPITAL_COMMUNITY): Payer: BC Managed Care – PPO

## 2022-05-04 ENCOUNTER — Ambulatory Visit (HOSPITAL_COMMUNITY)
Admission: RE | Admit: 2022-05-04 | Discharge: 2022-05-04 | Disposition: A | Discharge status: Home / Self Care | Payer: BC Managed Care – PPO | Source: Ambulatory Visit | Attending: Neurology | Admitting: Neurology

## 2022-05-04 ENCOUNTER — Encounter (HOSPITAL_COMMUNITY): Payer: Self-pay | Admitting: Internal Medicine

## 2022-05-04 ENCOUNTER — Ambulatory Visit (HOSPITAL_COMMUNITY): Payer: BC Managed Care – PPO

## 2022-05-04 DIAGNOSIS — D6859 Other primary thrombophilia: Secondary | ICD-10-CM | POA: Insufficient documentation

## 2022-05-04 DIAGNOSIS — G459 Transient cerebral ischemic attack, unspecified: Secondary | ICD-10-CM | POA: Diagnosis not present

## 2022-05-04 NOTE — Progress Notes (Signed)
TCD with bubbles has been completed. Preliminary results can be found in CV Proc through chart review.   05/04/22 3:35 PM Olen Cordial RVT

## 2022-05-07 ENCOUNTER — Telehealth: Payer: Self-pay

## 2022-05-07 NOTE — Telephone Encounter (Signed)
-----   Message from [name], MD sent at 05/04/2022  8:57 PM EST ----- No sign of PFO on exam, great news, continue with the rest of the workup please thanks!

## 2022-05-07 NOTE — Telephone Encounter (Signed)
I called patient.  I advised him that Dr. Lucia Gaskins reviewed his results and there was no sign of a PFO.  Patient should continue with the rest of his work-up.  Patient verbalized understanding of results.  Patient had no questions or concerns at this time.

## 2022-05-22 ENCOUNTER — Telehealth: Payer: Self-pay | Admitting: Neurology

## 2022-05-22 NOTE — Telephone Encounter (Signed)
Done

## 2022-05-22 NOTE — Telephone Encounter (Signed)
Terry Todd is calling. Asking if she can get pt last office note faxed to her [phone]. Stating there office sent referral.

## 2022-05-24 ENCOUNTER — Other Ambulatory Visit: Payer: Self-pay

## 2022-05-24 MED ORDER — PANTOPRAZOLE SODIUM 40 MG PO TBEC: 40.0000 mg | DELAYED_RELEASE_TABLET | Freq: Every day | ORAL | 1 refills | Status: DC

## 2022-05-30 ENCOUNTER — Telehealth: Payer: Self-pay | Admitting: *Deleted

## 2022-05-30 DIAGNOSIS — G459 Transient cerebral ischemic attack, unspecified: Secondary | ICD-10-CM

## 2022-05-30 NOTE — Telephone Encounter (Signed)
-----   Message from Antonia B Ahern, MD sent at 05/30/2022 12:47 PM EST ----- Cardiac monitor was negative. But this does not mean he does not have afib, sometimes it doesn't come back for months. Would he like to proceed with loop recorder as discussed? Please let me know 

## 2022-05-30 NOTE — Telephone Encounter (Signed)
-----   Message from [name], MD sent at 05/30/2022 12:47 PM EST ----- Cardiac monitor was negative. But this does not mean he does not have afib, sometimes it doesn't come back for months. Would he like to proceed with loop recorder as discussed? Please let me know

## 2022-05-30 NOTE — Telephone Encounter (Signed)
LVM for patient to call back to review test results

## 2022-05-30 NOTE — Telephone Encounter (Signed)
Pt returned call.  I gave him the cardiac event monitor results as negative but that did not mean he did not have afib.  Sometimes it does not come back for months.  Asked if he would like to proceed with loop monitor.  He said he would.  Has seen [facility] Servando Salina [facility] (last 2021).  I relayed that was not sure who was doing these procedures but would be in same practice.  He would be contacted by them.  He verbalized understanding.

## 2022-05-31 ENCOUNTER — Other Ambulatory Visit: Payer: Self-pay | Admitting: Neurology

## 2022-05-31 DIAGNOSIS — G459 Transient cerebral ischemic attack, unspecified: Secondary | ICD-10-CM

## 2022-05-31 NOTE — Telephone Encounter (Signed)
Done. thanks

## 2022-06-04 ENCOUNTER — Telehealth: Payer: Self-pay | Admitting: Neurology

## 2022-06-04 ENCOUNTER — Encounter: Payer: Self-pay | Admitting: Neurology

## 2022-06-04 ENCOUNTER — Telehealth: Payer: BC Managed Care – PPO | Admitting: Neurology

## 2022-06-04 DIAGNOSIS — R5383 Other fatigue: Secondary | ICD-10-CM | POA: Diagnosis not present

## 2022-06-04 DIAGNOSIS — G43009 Migraine without aura, not intractable, without status migrainosus: Secondary | ICD-10-CM | POA: Diagnosis not present

## 2022-06-04 DIAGNOSIS — G459 Transient cerebral ischemic attack, unspecified: Secondary | ICD-10-CM

## 2022-06-04 NOTE — Telephone Encounter (Signed)
Follow up 6 months prefer megan millikan can be video thanks

## 2022-06-04 NOTE — Addendum Note (Signed)
Addended by: Bertram Savin on: 06/04/2022 04:22 PM   Modules accepted: Orders

## 2022-06-04 NOTE — Progress Notes (Signed)
[facility]    Provider:  Dr Lucia Gaskins Requesting Provider: Philemon Kingdom, MD Primary Care Provider:  Philemon Kingdom, MD  CC:  Stroke-like episode  Virtual Visit via Video Note  I connected with Terry Todd on 06/04/22 at  3:30 PM EST by a video enabled telemedicine application and verified that I am speaking with the correct person using two identifiers.  Location: Patient: home Provider: office   I discussed the limitations of evaluation and management by telemedicine and the availability of in person appointments. The patient expressed understanding and agreed to proceed.   Follow Up Instructions:    I discussed the assessment and treatment plan with the patient. The patient was provided an opportunity to ask questions and all were answered. The patient agreed with the plan and demonstrated an understanding of the instructions.   The patient was advised to call back or seek an in-person evaluation if the symptoms worsen or if the condition fails to improve as anticipated.  I provided 30 minutes of non-face-to-face time during this encounter.   Terry Fret, MD   June 04, 2022: Patient was seen in October for TIA versus "complicated migraine".  So far workup has been negative.  An extensive panel for hypercoagulable disorders was negative.  Bernita Raisin was approved for migraine acute management.  Testing for PFO was negative.  30-day cardiac monitoring was negative.  I referred him for loop recorder. He has had several bad migraines but didn't try the ubrelvy for a while, next time take the [place] with you everywhere, he has had 2 bad migraines since being seen. The latest one was stress. Acute. No aura with these migraines, were his usual migraines.  He feels tired when he gets up, he had the sleep test this year and it was neg. Will check a tsh, take ubrelvy, continue aspirin,he is following up with an eye doctor he was put on some drops for  eye pressure unclear if glaucoma he is following up soon with them. If pressure is elevated it can cause headaches as well. No other focal neurologic deficits, associated symptoms, inciting events or modifiable factors.   Patient complains of symptoms per HPI as well as the following symptoms: fatigue . Pertinent negatives and positives per HPI. All others negative   HPI 04/11/2022:  Terry Todd is a 36 y.o. male here as requested by Philemon Kingdom, MD for "complicated migraine". PMHx "stroke-like episode" and "complicated migraines", asthma, obesity, GERD, hypertension, palpitations and tachycardia who presented to [facility] with left-sided weakness and numbness in his left face.  NIH was 3 and he was given tPA.  Patient is here alone and he reports he has had migraines most of his life but NEVER AN AURA, going back to middle school, mother had migraines, he has tried shots and everything growing up, in 2017 it started worsening, he has photophobia/phonophobia, nausea, pulsating/pounding/throbbing and always on the right side at the temple and can be moderate to severe can have a lot muscular pain. Moderate to severe. If treated it could last 1-2 hours otherwise could last 24-48 hours with pain. Prior to June 19th never had an aura. Usual frequency was 1-2 a week(6 total migraines a month with < 10 total headache days a month) . June 19th he was at church and they were in the building and he got some pain on the left side of his rib cage, kept getting worse, then he started feeling odd, felt weak walking left leg  and arm and he sat down and his left arm started going numb. A church member felt concerned, he had sensory changes, went to urgent care and had a stroke code at the hospital. He was having a problem answering questions, confusion, never had a heaache during the whole time and no hx of auras. Sleep apnea test was negative. No other focal neurologic deficits, associated symptoms,  inciting events or modifiable factors.  Reviewed notes, labs and imaging from outside physicians, which showed:  CLINICAL DATA:  Provided history: Neuro deficit, acute, stroke suspected.   EXAM: MRI HEAD WITHOUT CONTRAST   TECHNIQUE: Multiplanar, multiecho pulse sequences of the brain and surrounding structures were obtained without intravenous contrast.   COMPARISON:  Same-day brain MRI 12/12/2021. Head CT 12/11/2021.   FINDINGS: Brain:   Cerebral volume is normal.   No cortical encephalomalacia is identified. No significant cerebral white matter disease.   There is no acute infarct.   No evidence of an intracranial mass.   No chronic intracranial blood products.   No extra-axial fluid collection.   No midline shift.   Vascular: Maintained flow voids within the proximal large arterial vessels.   Skull and upper cervical spine: No focal suspicious marrow lesion.   Sinuses/Orbits: No mass or acute finding within the imaged orbits. Small mucous retention cyst within the right maxillary sinus. 2.3 cm mucous retention cyst within the left maxillary sinus. 2.7 cm mucous retention cyst within the left sphenoid sinus.   IMPRESSION: Unremarkable non-contrast MRI appearance of the brain. No evidence of acute intracranial abnormality.   Paranasal sinus mucous retention cysts, as described.  I reviewed notes from the hospital, [facility], he presented to [facility] and was transferred to [facility], he had left-sided weakness and numbness in his left face, he was at church helping with vacation [employer] when a COVID teacher asked him to bring something negative sharp abdominal pain and they brought him to medical urgent care, the urgent care doctor noticed left-sided weakness and sent him to [facility] ED for stroke work-up, tPA was given at 1914 and show NIH was a 3, and he went to [facility].  He endorsed a history of migraines but no previous history of  aura or focal neurologic deficits with his migraines, he still felt some weakness but denied any sensory changes.  From a thorough review of records, medications tried that can be used in headache management include: Fioricet, depakote, lexapro, robaxin, propranolol, amitriptyline, topamax, triptans contraindicated due to possible TIA, maxalt, imitrex oral and Injectable  Review of Systems: Patient complains of symptoms per HPI as well as the following symptoms migraines. Pertinent negatives and positives per HPI. All others negative.   Social History   Socioeconomic History   Marital status: Single    Spouse name: Not on file   Number of children: 0   Years of education: Not on file   Highest education level: Not on file  Occupational History    Comment: pastor  Tobacco Use   Smoking status: Never   Smokeless tobacco: Never  Vaping Use   Vaping Use: Never used  Substance and Sexual Activity   Alcohol use: Never   Drug use: Never   Sexual activity: Not on file  Other Topics Concern   Not on file  Social History Narrative   Lives with roommate   Right handed   Caffeine: 1-2 cups/day   Social Determinants of Health   Financial Resource Strain: Not on file  Food Insecurity: Not on file  Transportation Needs: Not on file  Physical Activity: Not on file  Stress: Not on file  Social Connections: Not on file  Intimate Partner Violence: Not on file    Family History  Problem Relation Age of Onset   Cirrhosis Mother    Hepatitis C Mother    Alcoholism Mother    High Cholesterol Father    Kidney Stones Father    Allergies Father    Asthma Father    Asthma Brother    Migraines Brother    Breast cancer Maternal Grandmother    Diabetes Mellitus II Paternal Grandmother    Diabetes Mellitus II Paternal Grandfather    Prostate cancer Paternal Grandfather    Colon cancer Neg Hx    Esophageal cancer Neg Hx    Pancreatic cancer Neg Hx    Liver cancer Neg Hx    Stomach  cancer Neg Hx     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   Laryngopharyngeal reflux (LPR) 07/21/2019   Migraines 01/04/2020   Obesity (BMI 30-39.9) 01/05/2020   Palpitations 01/05/2020   Right-sided chest wall pain    Sensation of lump in throat 03/27/2018   Shortness of breath 01/05/2020   Sinus arrhythmia 01/05/2020   Tachycardia     Patient Active Problem List   Diagnosis Date Noted   TIA (transient ischemic attack) 04/12/2022   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   Stroke-like symptom 12/12/2021   Facial pressure 09/09/2020   Sphenoid sinusitis 05/24/2020   Tachycardia    Right-sided chest wall pain    Migraine    GERD without esophagitis    Elevated transaminase level    BMI 36.0-36.9,adult    Adjustment insomnia    Sinus arrhythmia 01/05/2020   Palpitations 01/05/2020   Shortness of breath 01/05/2020   Obesity (BMI 30-39.9) 01/05/2020   Asthma 01/04/2020   Migraines 01/04/2020   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     Azelastine HCl (ASTEPRO) 0.15 % SOLN Place 1 spray into both nostrils daily as needed (For nasal congestion).     Budeson-Glycopyrrol-Formoterol (BREZTRI AEROSPHERE) 160-9-4.8 MCG/ACT AERO Inhale 2 puffs into the lungs in the morning and at bedtime. 5.9 g 0   cetirizine (ZYRTEC ALLERGY) 10 MG tablet Take 1 tablet (10 mg total) by mouth daily. 30 tablet 11   divalproex (DEPAKOTE ER) 250 MG 24 hr tablet Take 250 mg by mouth at  bedtime.     ibuprofen (ADVIL) 600 MG tablet Take 600 mg by mouth daily as needed for mild pain.     latanoprost (XALATAN) 0.005 % ophthalmic solution Place 1 drop into both eyes every evening.     lidocaine (LIDODERM) 5 % 1 patch daily.     pantoprazole (PROTONIX) 40 MG tablet Take 1 tablet (40 mg total) by mouth daily. Please schedule an office visit for further refills. 30 tablet 1   Spacer/Aero-Holding [employer]  DEVI 1 each by Does not apply route in the morning and at bedtime. Please use with MDI inhalers 1 each 0   Ubrogepant (UBRELVY) 100 MG TABS Take 100 mg by mouth every 2 (two) hours as needed. Maximum 200mg  a day. 16 tablet 11   Vitamin D, Ergocalciferol, (DRISDOL) 1.25 MG (50000 UNIT) CAPS capsule Take 50,000 Units by mouth every 7 (seven) days.     No current facility-administered medications for this visit.    Allergies as of 06/04/2022 - Review Complete 06/04/2022  Allergen Reaction Noted   Shellfish allergy  12/12/2021    Vitals: There were no vitals taken for this visit. Last Weight:  Wt Readings from Last 1 Encounters:  04/11/22 263 lb (119.3 kg)   Last Height:   Ht Readings from Last 1 Encounters:  04/11/22 5\' 11"  (1.803 m)     Physical exam: Exam: Gen: NAD, conversant, obese  CV: Denies palpitations or chest pain or SOB. VS: Breathing at a normal rate. Not febrile. Eyes: Conjunctivae clear without exudates or hemorrhage  Neuro: Detailed Neurologic Exam  Speech:    Speech is normal; fluent and spontaneous with normal comprehension.  Cognition:    The patient is oriented to person, place, and time;     recent and remote memory intact;     language fluent;     normal attention, concentration,     fund of knowledge Cranial Nerves:    The pupils are equal, round, and reactive to light. Visual fields are full. Extraocular movements are intact.  The face is symmetric with normal sensation. Hearing intact. Voice is normal.  Coordination:    Normal finger  to nose  Gait:    Normal native gait  Motor Observation:   no involuntary movements noted. Tone:    Appears normal  Posture:    Posture is normal. normal erect    Strength:    Strength is anti-gravity and symmetric in the upper and lower limbs.      Sensation: intact to LT       Assessment/Plan: 36 y.o. male here as requested by , MD for "complicated migraine". PMHx "stroke-like episode" and "complicated migraines", asthma, obesity, GERD, hypertension, palpitations and tachycardia who presented to [facility] with left-sided weakness and numbness in his left face.  NIH was 3 and he was given tPA.  Patient is here alone and he reports he has had migraines most of his life but NEVER AN AURA.  - I would consider what happened to this patient very unusual, he may have migraines but has never had an aura and had this episode without a migraine. He needs to be more thoroughly evaluated for TIA especially at his age. - fatigue - check tsh - Hypercoagulable panel for genetic causes of clots: negative - 30-day heart monitor looking for atrial fibrillation and then we may proceed to a TEE (Transesophageal echo) and loop recorder: Negative, referred for loop - Trans Cranial Doppler with bubbles looking for a patent foramen ovale: Negative - Baby aspirin 81mg  daily: continue Triptans contraindicated, try Ubrelvy: approved  Hospital course: MRI showed no acute intracranial abnormality, 2D echo with normal ejection fraction but moderate left ventricular hypertrophy noted (he needs to follow-up with primary care for this and possibly blood pressure control), LDL was 88, hemoglobin A1c 5.1.   Orders Placed This Encounter  Procedures   TSH Rfx on Abnormal to Free T4     Cc: Philemon Kingdom, MD,  [facility], MD   Lucia GaskinsAhern, MD  [facility] [address] [place], [place] [id number]  Phone [phone] Fax [phone]

## 2022-06-04 NOTE — Telephone Encounter (Signed)
Scheduled with Megan for a VV on 12/18/22 at 11:00 am.

## 2022-06-06 NOTE — Addendum Note (Signed)
Addended by: Bertram Savin on: 06/06/2022 10:30 AM   Modules accepted: Orders

## 2022-07-05 ENCOUNTER — Ambulatory Visit: Payer: BC Managed Care – PPO | Attending: Cardiology | Admitting: Cardiology

## 2022-07-05 ENCOUNTER — Encounter: Payer: Self-pay | Admitting: Cardiology

## 2022-07-05 VITALS — BP 110/86 | HR 82 | Ht 71.0 in | Wt 266.2 lb

## 2022-07-05 DIAGNOSIS — I10 Essential (primary) hypertension: Secondary | ICD-10-CM

## 2022-07-05 DIAGNOSIS — E669 Obesity, unspecified: Secondary | ICD-10-CM

## 2022-07-05 DIAGNOSIS — Z8673 Personal history of transient ischemic attack (TIA), and cerebral infarction without residual deficits: Secondary | ICD-10-CM

## 2022-07-05 NOTE — Patient Instructions (Signed)
Medication Instructions:  Your physician recommends that you continue on your current medications as directed. Please refer to the Current Medication list given to you today.  *If you need a refill on your cardiac medications before your next appointment, please call your pharmacy*   Lab Work: None   Testing/Procedures: None   Follow-Up: At [facility], you and your health needs are our priority.  As part of our continuing mission to provide you with exceptional heart care, we have created designated Provider Care Teams.  These Care Teams include your primary Cardiologist (physician) and Advanced Practice Providers (APPs -  Physician Assistants and Nurse Practitioners) who all work together to provide you with the care you need, when you need it.  We recommend signing up for the patient portal called "MyChart".  Sign up information is provided on this After Visit Summary.  MyChart is used to connect with patients for Virtual Visits (Telemedicine).  Patients are able to view lab/test results, encounter notes, upcoming appointments, etc.  Non-urgent messages can be sent to your provider as well.   To learn more about what you can do with MyChart, go to [email or web address].    Your next appointment:   1 year(s)  Provider:   Kardie Tobb, DO     Other Instructions   

## 2022-07-05 NOTE — Progress Notes (Signed)
Cardiology Office Note:    Date:  07/05/2022   ID:  Terry Todd, DOB [birth year], MRN [id number]  PCP:  Philemon Kingdom, MD  Cardiologist:  [name], DO  Electrophysiologist:  None   Referring MD: Philemon Kingdom, MD   " I had a recent TIA"   History of Present Illness:    Terry Todd is a 37 y.o. male with a hx of anxiety, obesity, hypertension today because he was asked by his neurologist to come to be evaluated for loop recorder.  I last saw the patient in May 2022 at that time he was hypertensive I asked him to continue his hydrochlorothiazide.  Since that time it appeared that he has been taken off the hydrochlorothiazide.  Today's visit was stemmed from the fact that he had a TIA back in October and has been following up with neuro.  He had a 30-day cardiac monitor which was negative with no arrhythmia.  And he has been asked to come and see cardiology for evaluation for loop recorder.  There is also concern for migraine headaches as well.  Here for no concerns for me today.  He denies chest pain shortness of breath or palpitations.   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   Azelastine HCl (ASTEPRO) 0.15 % SOLN Place 1 spray into both nostrils daily as  needed (For nasal congestion).   Budeson-Glycopyrrol-Formoterol (BREZTRI AEROSPHERE) 160-9-4.8 MCG/ACT AERO Inhale 2 puffs into the lungs in the morning and at bedtime.   cetirizine (ZYRTEC ALLERGY) 10 MG tablet Take 1 tablet (10 mg total) by mouth daily.   divalproex (DEPAKOTE ER) 250 MG 24 hr tablet Take 250 mg by mouth at bedtime.   ibuprofen (ADVIL) 600 MG tablet Take 600 mg by mouth daily as needed for mild pain.   latanoprost (XALATAN) 0.005 % ophthalmic solution Place 1 drop into both eyes every evening.   lidocaine (LIDODERM) 5 % 1 patch daily.   pantoprazole (PROTONIX) 40 MG tablet Take 1 tablet (40 mg total) by mouth daily. Please schedule an office visit for further refills.   Spacer/Aero-Holding Chambers DEVI 1 each by Does not apply route in the morning and at bedtime. Please use with MDI inhalers   Ubrogepant (UBRELVY) 100 MG TABS Take 100 mg by mouth every 2 (two) hours as needed. Maximum 200mg  a day.   Vitamin D, Ergocalciferol, (DRISDOL) 1.25 MG (50000 UNIT) CAPS capsule Take 50,000 Units by mouth every 7 (seven) days.     Allergies:   Shellfish allergy   Social History   Socioeconomic History   Marital status: Single    Spouse name: Not on file   Number of children: 0   Years of education:  Not on file   Highest education level: Not on file  Occupational History    Comment: pastor  Tobacco Use   Smoking status: Never   Smokeless tobacco: Never  Vaping Use   Vaping Use: Never used  Substance and Sexual Activity   Alcohol use: Never   Drug use: Never   Sexual activity: Not on file  Other Topics Concern   Not on file  Social History Narrative   Lives with roommate   Right handed   Caffeine: 1-2 cups/day   Social Determinants of Health   Financial Resource Strain: Not on file  Food Insecurity: Not on file  Transportation Needs: Not on file  Physical Activity: Not on file  Stress: Not on file  Social Connections: Not on file     Family History: The  patient's family history includes Alcoholism in his mother; Allergies in his father; Asthma in his brother and father; Breast cancer in his maternal grandmother; Cirrhosis in his mother; Diabetes Mellitus II in his paternal grandfather and paternal grandmother; Hepatitis C in his mother; High Cholesterol in his father; Kidney Stones in his father; Migraines in his brother; Prostate cancer in his paternal grandfather. There is no history of Colon cancer, Esophageal cancer, Pancreatic cancer, Liver cancer, or Stomach cancer.  ROS:   Review of Systems  Constitution: Negative for decreased appetite, fever and weight gain.  HENT: Negative for congestion, ear discharge, hoarse voice and sore throat.   Eyes: Negative for discharge, redness, vision loss in right eye and visual halos.  Cardiovascular: Negative for chest pain, dyspnea on exertion, leg swelling, orthopnea and palpitations.  Respiratory: Negative for cough, hemoptysis, shortness of breath and snoring.   Endocrine: Negative for heat intolerance and polyphagia.  Hematologic/Lymphatic: Negative for bleeding problem. Does not bruise/bleed easily.  Skin: Negative for flushing, nail changes, rash and suspicious lesions.  Musculoskeletal: Negative for arthritis, joint pain, muscle cramps, myalgias, neck pain and stiffness.  Gastrointestinal: Negative for abdominal pain, bowel incontinence, diarrhea and excessive appetite.  Genitourinary: Negative for decreased libido, genital sores and incomplete emptying.  Neurological: Negative for brief paralysis, focal weakness, headaches and loss of balance.  Psychiatric/Behavioral: Negative for altered mental status, depression and suicidal ideas.  Allergic/Immunologic: Negative for HIV exposure and persistent infections.    EKGs/Labs/Other Studies Reviewed:    The following studies were reviewed today:   EKG: None today  May 23, 2022 The patient wore the monitor for 30 days  starting April 22, 2022. Indication: TIA The minimum heart rate was 45 bpm, maximum heart rate was 162 bpm, and average heart rate was 79 bpm. Predominant underlying rhythm was Sinus Rhythm.   No pauses, No AV block and no atrial fibrillation present. All patient triggered events were associated with sinus rhythm, sinus arrhythmia and sinus tachycardia.   Conclusion: Normal/unremarkable study.   Echo with bubble 12/13/2021 IMPRESSIONS   1. Left ventricular ejection fraction, by estimation, is 60 to 65%. The  left ventricle has normal function. The left ventricle has no regional  wall motion abnormalities. There is moderate left ventricular hypertrophy.  Left ventricular diastolic  parameters were normal.   2. Right ventricular systolic function is normal. The right ventricular  size is normal.   3. The mitral valve is normal in structure. Trivial mitral valve  regurgitation. No evidence of mitral stenosis.   4. The aortic valve is normal in structure. Aortic valve regurgitation is  not visualized. No aortic stenosis is present.   5. The inferior  vena cava is normal in size with greater than 50%  respiratory variability, suggesting right atrial pressure of 3 mmHg.   6. Agitated saline contrast bubble study was negative, with no evidence  of any interatrial shunt.   FINDINGS   Left Ventricle: Left ventricular ejection fraction, by estimation, is 60  to 65%. The left ventricle has normal function. The left ventricle has no  regional wall motion abnormalities. The left ventricular internal cavity  size was normal in size. There is   moderate left ventricular hypertrophy. Left ventricular diastolic  parameters were normal.   Right Ventricle: The right ventricular size is normal. No increase in  right ventricular wall thickness. Right ventricular systolic function is  normal.   Left Atrium: Left atrial size was normal in size.   Right Atrium: Right atrial size was normal in size.   Pericardium:  There is no evidence of pericardial effusion.   Mitral Valve: The mitral valve is normal in structure. Trivial mitral  valve regurgitation. No evidence of mitral valve stenosis.   Tricuspid Valve: The tricuspid valve is normal in structure. Tricuspid  valve regurgitation is not demonstrated. No evidence of tricuspid  stenosis.   Aortic Valve: The aortic valve is normal in structure. Aortic valve  regurgitation is not visualized. No aortic stenosis is present. Aortic  valve peak gradient measures 3.7 mmHg.   Pulmonic Valve: The pulmonic valve was normal in structure. Pulmonic valve  regurgitation is trivial. No evidence of pulmonic stenosis.   Aorta: The aortic root is normal in size and structure. Ascending aorta  measurements are within normal limits for age when indexed to body surface  area.   Venous: The inferior vena cava is normal in size with greater than 50%  respiratory variability, suggesting right atrial pressure of 3 mmHg.   IAS/Shunts: No atrial level shunt detected by color flow Doppler. Agitated  saline contrast was given intravenously to evaluate for intracardiac  shunting. Agitated saline contrast bubble study was negative, with no  evidence of any interatrial shunt.   Recent Labs: 03/11/2022: ALT 32; BUN 13; Creatinine, Ser 0.91; Hemoglobin 13.8; Platelets 247; Potassium 3.6; Sodium 142  Recent Lipid Panel    Component Value Date/Time   CHOL 149 12/13/2021 0509   TRIG 139 12/13/2021 0509   HDL 33 (L) 12/13/2021 0509   CHOLHDL 4.5 12/13/2021 0509   VLDL 28 12/13/2021 0509   LDLCALC 88 12/13/2021 0509    Physical Exam:    VS:  BP 110/86   Pulse 82   Ht 5\' 11"  (1.803 m)   Wt 120.7 kg   SpO2 97%   BMI 37.13 kg/m     Wt Readings from Last 3 Encounters:  07/05/22 120.7 kg  04/11/22 119.3 kg  03/11/22 123 kg     GEN: Well nourished, well developed in no acute distress HEENT: Normal NECK: No JVD; No carotid bruits LYMPHATICS: No  lymphadenopathy CARDIAC: S1S2 noted,RRR, no murmurs, rubs, gallops RESPIRATORY:  Clear to auscultation without rales, wheezing or rhonchi  ABDOMEN: Soft, non-tender, non-distended, +bowel sounds, no guarding. EXTREMITIES: No edema, No cyanosis, no clubbing MUSCULOSKELETAL:  No deformity  SKIN: Warm and dry NEUROLOGIC:  Alert and oriented x 3, non-focal PSYCHIATRIC:  Normal affect, good insight  ASSESSMENT:    1. History of TIA (transient ischemic attack)   2. Hypertension, unspecified type   3. Obesity (BMI 30-39.9)    PLAN:    TIA, he has been following with neurology  This 30-day event monitor did  not show any evidence of rhythm with me.  Agree with having the patient be further evaluated by EP for loop recorder implantation.  The patient is in agreement with the above plan. The patient left the office in stable condition.  The patient will follow up in   Medication Adjustments/Labs and Tests Ordered: Current medicines are reviewed at length with the patient today.  Concerns regarding medicines are outlined above.  Orders Placed This Encounter  Procedures   Ambulatory referral to Cardiac Electrophysiology   No orders of the defined types were placed in this encounter.   Patient Instructions  Medication Instructions:  Your physician recommends that you continue on your current medications as directed. Please refer to the Current Medication list given to you today.  *If you need a refill on your cardiac medications before your next appointment, please call your pharmacy*   Lab Work: None   Testing/Procedures: None   Follow-Up: At [facility], you and your health needs are our priority.  As part of our continuing mission to provide you with exceptional heart care, we have created designated Provider Care Teams.  These Care Teams include your primary Cardiologist (physician) and Advanced Practice Providers (APPs -  Physician Assistants and Nurse Practitioners)  who all work together to provide you with the care you need, when you need it.  We recommend signing up for the patient portal called "MyChart".  Sign up information is provided on this After Visit Summary.  MyChart is used to connect with patients for Virtual Visits (Telemedicine).  Patients are able to view lab/test results, encounter notes, upcoming appointments, etc.  Non-urgent messages can be sent to your provider as well.   To learn more about what you can do with MyChart, go to [email or web address].    Your next appointment:   1 year(s)  Provider:   [name], DO     Other Instructions     Adopting a Healthy Lifestyle.  Know what a healthy weight is for you (roughly BMI <25) and aim to maintain this   Aim for 7+ servings of fruits and vegetables daily   65-80+ fluid ounces of water or unsweet tea for healthy kidneys   Limit to max 1 drink of alcohol per day; avoid smoking/tobacco   Limit animal fats in diet for cholesterol and heart health - choose grass fed whenever available   Avoid highly processed foods, and foods high in saturated/trans fats   Aim for low stress - take time to unwind and care for your mental health   Aim for 150 min of moderate intensity exercise weekly for heart health, and weights twice weekly for bone health   Aim for 7-9 hours of sleep daily   When it comes to diets, agreement about the perfect plan isnt easy to find, even among the experts. Experts at the [facility] [employer] developed an idea known as the Healthy Eating Plate. Just imagine a plate divided into logical, healthy portions.   The emphasis is on diet quality:   Load up on vegetables and fruits - one-half of your plate: Aim for color and variety, and remember that potatoes dont count.   Go for whole grains - one-quarter of your plate: Whole wheat, barley, wheat berries, quinoa, oats, brown rice, and foods made with them. If you want pasta, go with whole wheat  pasta.   Protein power - one-quarter of your plate: Fish, chicken, beans, and nuts are all healthy, versatile protein sources. Limit  red meat.   The diet, however, does go beyond the plate, offering a few other suggestions.   Use healthy plant oils, such as olive, canola, soy, corn, sunflower and peanut. Check the labels, and avoid partially hydrogenated oil, which have unhealthy trans fats.   If youre thirsty, drink water. Coffee and tea are good in moderation, but skip sugary drinks and limit milk and dairy products to one or two daily servings.   The type of carbohydrate in the diet is more important than the amount. Some sources of carbohydrates, such as vegetables, fruits, whole grains, and beans-are healthier than others.   Finally, stay active  Signed, [name], DO  07/05/2022 10:36 AM    [facility]

## 2022-07-21 ENCOUNTER — Telehealth: Payer: Self-pay | Admitting: Neurology

## 2022-07-21 NOTE — Telephone Encounter (Signed)
I received a text message that patient had called [employer] answering service about concerns about interaction of Paxlovid with one of his medications.  I called the listed number and left a message for the patient to call me back as he did not pick up.

## 2022-07-24 ENCOUNTER — Emergency Department (HOSPITAL_COMMUNITY)
Admission: EM | Admit: 2022-07-24 | Discharge: 2022-07-24 | Disposition: A | Discharge status: Home / Self Care | Payer: BC Managed Care – PPO | Attending: Emergency Medicine | Admitting: Emergency Medicine

## 2022-07-24 ENCOUNTER — Emergency Department (HOSPITAL_COMMUNITY): Payer: BC Managed Care – PPO

## 2022-07-24 ENCOUNTER — Other Ambulatory Visit: Payer: Self-pay

## 2022-07-24 ENCOUNTER — Encounter (HOSPITAL_COMMUNITY): Payer: Self-pay

## 2022-07-24 DIAGNOSIS — R0602 Shortness of breath: Secondary | ICD-10-CM | POA: Diagnosis present

## 2022-07-24 DIAGNOSIS — J069 Acute upper respiratory infection, unspecified: Secondary | ICD-10-CM | POA: Diagnosis not present

## 2022-07-24 DIAGNOSIS — Z8616 Personal history of COVID-19: Secondary | ICD-10-CM | POA: Insufficient documentation

## 2022-07-24 DIAGNOSIS — Z7951 Long term (current) use of inhaled steroids: Secondary | ICD-10-CM | POA: Diagnosis not present

## 2022-07-24 DIAGNOSIS — J45909 Unspecified asthma, uncomplicated: Secondary | ICD-10-CM | POA: Diagnosis not present

## 2022-07-24 DIAGNOSIS — R000 Tachycardia, unspecified: Secondary | ICD-10-CM | POA: Diagnosis not present

## 2022-07-24 MED ORDER — IPRATROPIUM-ALBUTEROL 0.5-2.5 (3) MG/3ML IN SOLN
3.0000 mL | Freq: Once | RESPIRATORY_TRACT | Status: AC
  Administered 2022-07-24: 3 mL via RESPIRATORY_TRACT
  Filled 2022-07-24: qty 3

## 2022-07-24 NOTE — ED Provider Notes (Signed)
[facility] Provider Note   CSN: [id number] Arrival date & time: 07/24/22  2620     History  Chief Complaint  Patient presents with   Shortness of Breath    Terry Todd is a 37 y.o. male with PMH migraines, well treated asthma presenting with shortness of breath and wheezing after testing positive for COVID 3 days ago.  He says that he started developing shortness of breath and fevers on Saturday and had a COVID test which came back positive.  Since then he has had loss of taste, myalgias, fatigue, diarrhea.  His shortness of breath has persisted and he has had some wheezing as well especially at night.  He was specifically concerned about pneumonia as he has had this before.  He also endorses mild chest wall pain and tightness specifically with coughing fits.  Tmax was 103 degrees 3 days ago.  Tmax today was about 100 degrees.  He denies any nausea or vomiting, headaches, new neurological symptoms, lower extremity edema, or anginal chest pain.  He states that his asthma has been well-controlled over the past few days on his typical regimen of Breztri daily and albuterol as needed.  She has taken ibuprofen for the fever which seems to help.  He has continue to take in good p.o. intake.     Home Medications Prior to Admission medications   Medication Sig Start Date End Date Taking? Authorizing Provider  albuterol (VENTOLIN HFA) 108 (90 Base) MCG/ACT inhaler Inhale 2 puffs into the lungs every 4 (four) hours as needed for shortness of breath. 10/27/19   [provider]  Azelastine HCl (ASTEPRO) 0.15 % SOLN Place 1 spray into both nostrils daily as needed (For nasal congestion).    [provider]  Budeson-Glycopyrrol-Formoterol (BREZTRI AEROSPHERE) 160-9-4.8 MCG/ACT AERO Inhale 2 puffs into the lungs in the morning and at bedtime. 08/24/21   Maryjane Hurter, MD  cetirizine (ZYRTEC ALLERGY) 10 MG tablet Take 1 tablet (10 mg  total) by mouth daily. 08/24/21   Maryjane Hurter, MD  divalproex (DEPAKOTE ER) 250 MG 24 hr tablet Take 250 mg by mouth at bedtime. 12/18/21   [provider]  ibuprofen (ADVIL) 600 MG tablet Take 600 mg by mouth daily as needed for mild pain. 12/09/19   [provider]  latanoprost (XALATAN) 0.005 % ophthalmic solution Place 1 drop into both eyes every evening. 08/01/21   [provider]  lidocaine (LIDODERM) 5 % 1 patch daily. 11/17/21   [provider]  pantoprazole (PROTONIX) 40 MG tablet Take 1 tablet (40 mg total) by mouth daily. Please schedule an office visit for further refills. 05/24/22   Armbruster, Carlota Raspberry, MD  Spacer/Aero-Holding Josiah Lobo DEVI 1 each by Does not apply route in the morning and at bedtime. Please use with MDI inhalers 08/24/21   Maryjane Hurter, MD  Ubrogepant (UBRELVY) 100 MG TABS Take 100 mg by mouth every 2 (two) hours as needed. Maximum 200mg  a day. 04/12/22   Melvenia Beam, MD  Vitamin D, Ergocalciferol, (DRISDOL) 1.25 MG (50000 UNIT) CAPS capsule Take 50,000 Units by mouth every 7 (seven) days. 01/16/21   [provider]      Allergies    Shellfish allergy    Review of Systems   See HPI  Physical Exam Updated Vital Signs BP (!) 160/105 (BP Location: Right Arm)   Pulse (!) 103   Temp 99.1 F (37.3 C) (Oral)   Resp  18   SpO2 100%  Physical Exam Constitutional:      General: He is not in acute distress.    Appearance: He is not ill-appearing.  Cardiovascular:     Rate and Rhythm: Regular rhythm. Tachycardia present.     Heart sounds: Normal heart sounds. No murmur heard.    No friction rub. No gallop.     Comments: No BLE pitting edema Pulmonary:     Effort: Pulmonary effort is normal. No tachypnea, accessory muscle usage or respiratory distress.     Breath sounds: No stridor. Wheezing (Mild diffuse expiratory wheezing) present. No decreased breath sounds or rhonchi.  Abdominal:     Palpations: Abdomen  is soft.     Tenderness: There is no abdominal tenderness.  Skin:    General: Skin is warm and dry.  Neurological:     General: No focal deficit present.     Mental Status: He is alert.  Psychiatric:        Mood and Affect: Mood normal.     ED Results / Procedures / Treatments   Labs (all labs ordered are listed, but only abnormal results are displayed) Labs Reviewed - No data to display  EKG None  Radiology DG CHEST PORT 1 VIEW  Result Date: 07/24/2022 CLINICAL DATA:  Shortness of breath EXAM: PORTABLE CHEST 1 VIEW COMPARISON:  03/28/2022 FINDINGS: The heart size and mediastinal contours are within normal limits. Both lungs are clear. The visualized skeletal structures are unremarkable. IMPRESSION: No active disease. Electronically Signed   By: Nelson Chimes M.D.   On: 07/24/2022 10:24      Medications Ordered in ED Medications  ipratropium-albuterol (DUONEB) 0.5-2.5 (3) MG/3ML nebulizer solution 3 mL (3 mLs Nebulization Given 07/24/22 1014)    ED Course/ Medical Decision Making/ A&P                             Medical Decision Making Amount and/or Complexity of Data Reviewed Radiology: ordered.  Risk Prescription drug management.   37 year old male with past medical history of complicated migraines, well-controlled asthma presenting with shortness of breath and wheezing after testing positive for COVID 3 days ago.  He is mostly concerned that his shortness of breath and wheezing has persisted and is worse at night now.  His fever and other symptoms are stable and fevers likely improving today.  Will rule out pneumonia with chest x-ray and treat with DuoNebs as he has some mild wheezing on initial presentation.  If he remains stable he can be discharged with PCP follow-up.   Final Clinical Impression(s) / ED Diagnoses Final diagnoses:  None    Rx / DC Orders ED Discharge Orders     None         Linus Galas, MD 07/24/22 [place]    Carmin Muskrat, MD 07/30/22 2342

## 2022-07-24 NOTE — Discharge Instructions (Signed)
Your chest x-ray did not look concerning for pneumonia.  I would use Robitussin to help with your cough and continue using Tylenol or ibuprofen for fevers.  Make sure you take in plenty of fluids and your symptoms should improve over the next few days.  If you start having worse shortness of breath, wheezing, chest pain, please come back to the ED to be evaluated.

## 2022-07-24 NOTE — ED Triage Notes (Signed)
Patient tested positive for covid on Saturday. Terry Todd he feels short of breath, wheezing, chest tightness, fever that went to 103F Sunday. Pain in his right eye and nose.

## 2022-08-20 ENCOUNTER — Institutional Professional Consult (permissible substitution): Payer: BC Managed Care – PPO | Admitting: Cardiology

## 2022-09-24 DIAGNOSIS — R1012 Left upper quadrant pain: Secondary | ICD-10-CM | POA: Diagnosis not present

## 2022-09-24 DIAGNOSIS — R1032 Left lower quadrant pain: Secondary | ICD-10-CM | POA: Diagnosis not present

## 2022-09-25 DIAGNOSIS — R1032 Left lower quadrant pain: Secondary | ICD-10-CM | POA: Diagnosis not present

## 2022-09-25 DIAGNOSIS — R1012 Left upper quadrant pain: Secondary | ICD-10-CM | POA: Diagnosis not present

## 2022-10-01 ENCOUNTER — Encounter: Payer: Self-pay | Admitting: Cardiology

## 2022-10-01 ENCOUNTER — Ambulatory Visit: Payer: BC Managed Care – PPO | Attending: Cardiology | Admitting: Cardiology

## 2022-10-01 VITALS — BP 140/92 | HR 88 | Ht 71.0 in | Wt 285.0 lb

## 2022-10-01 DIAGNOSIS — G459 Transient cerebral ischemic attack, unspecified: Secondary | ICD-10-CM | POA: Diagnosis not present

## 2022-10-01 NOTE — Patient Instructions (Signed)
Medication Instructions:  Your physician recommends that you continue on your current medications as directed. Please refer to the Current Medication list given to you today.  Labwork: None ordered.  Testing/Procedures: None ordered.  Follow-Up:  Your physician wants you to follow-up as needed with Dr. Camnitz.     Implantable Loop Recorder Removal, Care After This sheet gives you information about how to care for yourself after your procedure. Your health care provider may also give you more specific instructions. If you have problems or questions, contact your health care provider. What can I expect after the procedure? After the procedure, it is common to have: Soreness or discomfort near the incision. Some swelling or bruising near the incision.  Follow these instructions at home: Incision care  Monitor your cardiac device site for redness, swelling, and drainage. Call the device clinic at [phone] if you experience these symptoms or fever/chills.  Keep the large square bandage on your site for 24 hours and then you may remove it yourself. Keep the steri-strips underneath in place.   You may shower after 72 hours / 3 days from your procedure with the steri-strips in place. They will usually fall off on their own, or may be removed after 10 days. Pat dry.   Avoid lotions, ointments, or perfumes over your incision until it is well-healed.  Please do not submerge in water until your site is completely healed.   If your wound site starts to bleed apply pressure.       If you have any questions/concerns please call the device clinic at [phone].  Activity  Return to your normal activities.  Contact a health care provider if: You have redness, swelling, or pain around your incision. You have a fever.  

## 2022-10-01 NOTE — Progress Notes (Signed)
Electrophysiology Office Note   Date:  10/01/2022   ID:  Terry Todd, DOB [birth year], MRN [id number]  PCP:  Terry Kingdom, MD  Cardiologist:  Tobb Primary Electrophysiologist:  Kashawn Dirr Jorja Loa, MD    Chief Complaint: TIA   History of Present Illness: Terry Todd is a 37 y.o. male who is being seen today for the evaluation of TIA at the request of Terry Kingdom, MD. Presenting today for electrophysiology evaluation.  He has a history significant for anxiety, obesity, hypertension.  October 2023 he had a TIA.  He had been following up with neurology.  He wore a 30-day monitor that showed no arrhythmia.  When he presented to the hospital he was found to have weakness.    Today, he denies symptoms of palpitations, chest pain, shortness of breath, orthopnea, PND, lower extremity edema, claudication, dizziness, presyncope, syncope, bleeding, or neurologic sequela. The patient is tolerating medications without difficulties.    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     Azelastine HCl (ASTEPRO) 0.15 % SOLN Place 1 spray into both nostrils daily as needed (For nasal congestion).     Budeson-Glycopyrrol-Formoterol (BREZTRI AEROSPHERE) 160-9-4.8 MCG/ACT  AERO Inhale 2 puffs into the lungs in the morning and at bedtime. 5.9 g 0   cetirizine (ZYRTEC ALLERGY) 10 MG tablet Take 1 tablet (10 mg total) by mouth daily. 30 tablet 11   divalproex (DEPAKOTE ER) 250 MG 24 hr tablet Take 250 mg by mouth at bedtime.     ibuprofen (ADVIL) 600 MG tablet Take 600 mg by mouth daily as needed for mild pain.     latanoprost (XALATAN) 0.005 % ophthalmic solution Place 1 drop into both eyes every evening.     lidocaine (LIDODERM) 5 % 1 patch daily.     pantoprazole (PROTONIX) 40 MG tablet Take 1 tablet (40 mg total) by mouth daily. Please schedule an office visit for further refills. 30 tablet 1   Spacer/Aero-Holding Chambers DEVI 1 each by Does not apply route in the morning and at bedtime. Please use with MDI inhalers 1 each 0   Ubrogepant (UBRELVY) 100 MG TABS Take 100 mg by mouth every 2 (two) hours as needed. Maximum 200mg  a day. 16 tablet 11   Vitamin D, Ergocalciferol, (DRISDOL) 1.25 MG (50000 UNIT) CAPS capsule Take 50,000 Units by mouth every 7 (seven) days.     No current facility-administered medications for this visit.    Allergies:   Shellfish allergy   Social History:  The patient  reports that he has never smoked. He has never used smokeless tobacco. He reports that he does not drink alcohol and does not use drugs.   Family History:  The  patient's family history includes Alcoholism in his mother; Allergies in his father; Asthma in his brother and father; Breast cancer in his maternal grandmother; Cirrhosis in his mother; Diabetes Mellitus II in his paternal grandfather and paternal grandmother; Hepatitis C in his mother; High Cholesterol in his father; Kidney Stones in his father; Migraines in his brother; Prostate cancer in his paternal grandfather.    ROS:  Please see the history of present illness.   Otherwise, review of systems is positive for none.   All other systems are reviewed and negative.    PHYSICAL EXAM: VS:  BP (!) 140/92   Pulse 88    Ht 5\' 11"  (1.803 m)   Wt 285 lb (129.3 kg)   SpO2 98%   BMI 39.75 kg/m  , BMI Body mass index is 39.75 kg/m. GEN: Well nourished, well developed, in no acute distress  HEENT: normal  Neck: no JVD, carotid bruits, or masses Cardiac: RRR; no murmurs, rubs, or gallops,no edema  Respiratory:  clear to auscultation bilaterally, normal work of breathing GI: soft, nontender, nondistended, + BS MS: no deformity or atrophy  Skin: warm and dry Neuro:  Strength and sensation are intact Psych: euthymic mood, full affect  EKG:  EKG is not ordered today. Personal review of the ekg ordered 03/13/22 shows sinus rhythm  Recent Labs: 03/11/2022: ALT 32; BUN 13; Creatinine, Ser 0.91; Hemoglobin 13.8; Platelets 247; Potassium 3.6; Sodium 142    Lipid Panel     Component Value Date/Time   CHOL 149 12/13/2021 0509   TRIG 139 12/13/2021 0509   HDL 33 (L) 12/13/2021 0509   CHOLHDL 4.5 12/13/2021 0509   VLDL 28 12/13/2021 0509   LDLCALC 88 12/13/2021 0509     Wt Readings from Last 3 Encounters:  10/01/22 285 lb (129.3 kg)  07/05/22 266 lb 3.2 oz (120.7 kg)  04/11/22 263 lb (119.3 kg)      Other studies Reviewed: Additional studies/ records that were reviewed today include: TTE 12/13/21  Review of the above records today demonstrates:   1. Left ventricular ejection fraction, by estimation, is 60 to 65%. The  left ventricle has normal function. The left ventricle has no regional  wall motion abnormalities. There is moderate left ventricular hypertrophy.  Left ventricular diastolic  parameters were normal.   2. Right ventricular systolic function is normal. The right ventricular  size is normal.   3. The mitral valve is normal in structure. Trivial mitral valve  regurgitation. No evidence of mitral stenosis.   4. The aortic valve is normal in structure. Aortic valve regurgitation is  not visualized. No aortic stenosis is present.   5. The inferior vena cava is normal in size with greater  than 50%  respiratory variability, suggesting right atrial pressure of 3 mmHg.   6. Agitated saline contrast bubble study was negative, with no evidence  of any interatrial shunt.   Cardiac monitor 05/30/2022 personally reviewed No pauses, No AV block and no atrial fibrillation present. All patient triggered events were associated with sinus rhythm, sinus arrhythmia and sinus tachycardia.  ASSESSMENT AND PLAN:  1.  TIA: No arrhythmia on recent monitor.  He has no acute complaints and has recovered from his TIA.  We discussed further cardiac monitoring to look for atrial fibrillation.  Sylvester Salonga plan for ILR implant.  Risk and benefits of been discussed.  Risks include bleeding and infection.  Patient understands the risks and is agreed to the procedure.    Current medicines are reviewed  at length with the patient today.   The patient does not have concerns regarding his medicines.  The following changes were made today:  none  Labs/ tests ordered today include:  No orders of the defined types were placed in this encounter.    Disposition:   FU with Gwendolyne Welford ending ILR results  Signed, Syann Cupples Jorja Loa, MD  10/01/2022 8:58 AM     [facility] [address] [place] [address] [phone] (office) [phone] (fax)  SURGEON:  Alaura Schippers Jorja Loa, MD     PREPROCEDURE DIAGNOSIS:  TIA     POSTPROCEDURE DIAGNOSIS: TIA     PROCEDURES:   1. Implantable loop recorder implantation    INTRODUCTION:  DAMANI TOULOUSE presents with a history of TIA The costs of loop recorder monitoring have been discussed with the patient. Appropriate time out was performed prior to the procedure.    DESCRIPTION OF PROCEDURE:  Informed written consent was obtained.  The patient required no sedation for the procedure today.  Mapping over the patient's chest was performed to identify the area where electrograms were most prominent for ILR recording.  This area was found to  be the left parasternal region over the 4th intercostal space. The patients left chest was therefore prepped and draped in the usual sterile fashion. The skin overlying the left parasternal region was infiltrated with lidocaine for local analgesia.  A 0.5-cm incision was made over the left parasternal region over the 3rd intercostal space.  A subcutaneous ILR pocket was fashioned using a combination of sharp and blunt dissection.  A [employer] Reveal LINQ (serial # [id number]) implantable loop recorder was then placed into the pocket  R waves were very prominent and measured 0.54mV.  Steri- Strips and a sterile dressing were then applied.  There were no early apparent complications.     CONCLUSIONS:   1. Successful implantation of a implantable loop recorder for a history of TIA  2. No early apparent complications.   Dalary Hollar Jorja Loa, MD  10/01/2022 8:58 AM

## 2022-10-09 ENCOUNTER — Other Ambulatory Visit: Payer: Self-pay

## 2022-10-09 MED ORDER — PANTOPRAZOLE SODIUM 40 MG PO TBEC: 40.0000 mg | DELAYED_RELEASE_TABLET | Freq: Every day | ORAL | 0 refills | Status: DC

## 2022-10-11 ENCOUNTER — Other Ambulatory Visit (HOSPITAL_COMMUNITY): Payer: BC Managed Care – PPO

## 2022-10-11 ENCOUNTER — Emergency Department (HOSPITAL_COMMUNITY)
Admission: EM | Admit: 2022-10-11 | Discharge: 2022-10-11 | Disposition: A | Discharge status: Home / Self Care | Payer: BC Managed Care – PPO | Attending: Emergency Medicine | Admitting: Emergency Medicine

## 2022-10-11 ENCOUNTER — Other Ambulatory Visit: Payer: Self-pay

## 2022-10-11 ENCOUNTER — Encounter (HOSPITAL_COMMUNITY): Payer: Self-pay

## 2022-10-11 ENCOUNTER — Emergency Department (HOSPITAL_COMMUNITY): Payer: BC Managed Care – PPO

## 2022-10-11 DIAGNOSIS — R29818 Other symptoms and signs involving the nervous system: Secondary | ICD-10-CM | POA: Diagnosis not present

## 2022-10-11 DIAGNOSIS — Z8673 Personal history of transient ischemic attack (TIA), and cerebral infarction without residual deficits: Secondary | ICD-10-CM | POA: Diagnosis not present

## 2022-10-11 DIAGNOSIS — R202 Paresthesia of skin: Secondary | ICD-10-CM | POA: Diagnosis not present

## 2022-10-11 DIAGNOSIS — R200 Anesthesia of skin: Secondary | ICD-10-CM | POA: Diagnosis not present

## 2022-10-11 DIAGNOSIS — R079 Chest pain, unspecified: Secondary | ICD-10-CM

## 2022-10-11 DIAGNOSIS — R0789 Other chest pain: Secondary | ICD-10-CM | POA: Diagnosis not present

## 2022-10-11 LAB — CBC
HCT: 43.3 % (ref 39.0–52.0)
Hemoglobin: 14.7 g/dL (ref 13.0–17.0)
MCH: 30.2 pg (ref 26.0–34.0)
MCHC: 33.9 g/dL (ref 30.0–36.0)
MCV: 89.1 fL (ref 80.0–100.0)
Platelets: 362 10*3/uL (ref 150–400)
RBC: 4.86 MIL/uL (ref 4.22–5.81)
RDW: 12.9 % (ref 11.5–15.5)
WBC: 10.1 10*3/uL (ref 4.0–10.5)
nRBC: 0 % (ref 0.0–0.2)

## 2022-10-11 LAB — TROPONIN I (HIGH SENSITIVITY)
Troponin I (High Sensitivity): 3 ng/L (ref <18)
Troponin I (High Sensitivity): 2 ng/L (ref <18)

## 2022-10-11 LAB — BASIC METABOLIC PANEL
Anion gap: 9 (ref 5–15)
BUN: 10 mg/dL (ref 6–20)
CO2: 24 mmol/L (ref 22–32)
Calcium: 9.2 mg/dL (ref 8.9–10.3)
Chloride: 106 mmol/L (ref 98–111)
Creatinine, Ser: 1.12 mg/dL (ref 0.61–1.24)
GFR, Estimated: >60 mL/min (ref >60)
Glucose, Bld: 90 mg/dL (ref 70–99)
Potassium: 3.9 mmol/L (ref 3.5–5.1)
Sodium: 139 mmol/L (ref 135–145)

## 2022-10-11 MED ORDER — IOHEXOL 350 MG/ML SOLN
75.0000 mL | Freq: Once | INTRAVENOUS | Status: AC | PRN
  Administered 2022-10-11: 75 mL via INTRAVENOUS

## 2022-10-11 MED ORDER — ASPIRIN 325 MG PO TABS
325.0000 mg | ORAL_TABLET | Freq: Once | ORAL | Status: AC
  Administered 2022-10-11: 325 mg via ORAL
  Filled 2022-10-11: qty 1

## 2022-10-11 NOTE — ED Triage Notes (Signed)
Mid sternal chest pain that started around 0815 today that radiates to left arm. Pt states arm is worse than chest pain. States he has nausea, denies SOB.

## 2022-10-11 NOTE — ED Provider Notes (Signed)
[place] EMERGENCY DEPARTMENT AT [facility] Provider Note   CSN: [id number] Arrival date & time: 10/11/22  1655     History  Chief Complaint  Patient presents with   Chest Pain    DERICK SEMINARA is a 37 y.o. male history of TIA, here presenting with chest pain and left arm numbness.  Patient was diagnosed with TIA last year.  He had a negative MRI at that time.  Patient has intermittent left arm numbness since then and had seen neurology.  Patient had a negative Holter monitor but given persistent concern for possible A-fib, patient had a loop recorder placed about a week ago.  Patient states that today he started acute onset of left arm pain and numbness and also left chest pain.  Patient had a echo done last year that did not show any wall motion abnormality.  Patient wanted to be assessed and make sure he did not have another stroke or heart attack.  He states that the numbness has improved since he has been here.  The history is provided by the patient.       Home Medications Prior to Admission medications   Medication Sig Start Date End Date Taking? Authorizing Provider  albuterol (VENTOLIN HFA) 108 (90 Base) MCG/ACT inhaler Inhale 2 puffs into the lungs every 4 (four) hours as needed for shortness of breath. 10/27/19   [provider]  Azelastine HCl (ASTEPRO) 0.15 % SOLN Place 1 spray into both nostrils daily as needed (For nasal congestion).    [provider]  Budeson-Glycopyrrol-Formoterol (BREZTRI AEROSPHERE) 160-9-4.8 MCG/ACT AERO Inhale 2 puffs into the lungs in the morning and at bedtime. 08/24/21   Omar Person, MD  cetirizine (ZYRTEC ALLERGY) 10 MG tablet Take 1 tablet (10 mg total) by mouth daily. 08/24/21   Omar Person, MD  divalproex (DEPAKOTE ER) 250 MG 24 hr tablet Take 250 mg by mouth at bedtime. 12/18/21   [provider]  ibuprofen (ADVIL) 600 MG tablet Take 600 mg by mouth daily as needed for mild pain. 12/09/19    [provider]  latanoprost (XALATAN) 0.005 % ophthalmic solution Place 1 drop into both eyes every evening. 08/01/21   [provider]  lidocaine (LIDODERM) 5 % 1 patch daily. 11/17/21   [provider]  pantoprazole (PROTONIX) 40 MG tablet Take 1 tablet (40 mg total) by mouth daily. Please keep your May 3 appointment for further refills 10/09/22   Armbruster, Willaim Rayas, MD  Spacer/Aero-Holding Deretha Emory DEVI 1 each by Does not apply route in the morning and at bedtime. Please use with MDI inhalers 08/24/21   Omar Person, MD  Ubrogepant (UBRELVY) 100 MG TABS Take 100 mg by mouth every 2 (two) hours as needed. Maximum 200mg  a day. 04/12/22   [name], MD  Vitamin D, Ergocalciferol, (DRISDOL) 1.25 MG (50000 UNIT) CAPS capsule Take 50,000 Units by mouth every 7 (seven) days. 01/16/21   [provider]      Allergies    Sesame seed (diagnostic) and Shellfish allergy    Review of Systems   Review of Systems  Cardiovascular:  Positive for chest pain.  All other systems reviewed and are negative.   Physical Exam Updated Vital Signs BP (!) 147/92   Pulse 84   Temp 98.1 F (36.7 C) (Oral)   Resp 15   Ht 5\' 11"  (1.803 m)   Wt 122.5 kg   SpO2 93%   BMI 37.66 kg/m  Physical Exam Vitals and nursing note reviewed.  Constitutional:      Appearance: He is well-developed.  HENT:     Head: Normocephalic.  Eyes:     Extraocular Movements: Extraocular movements intact.     Pupils: Pupils are equal, round, and reactive to light.  Cardiovascular:     Rate and Rhythm: Normal rate and regular rhythm.     Heart sounds: Normal heart sounds.  Pulmonary:     Effort: Pulmonary effort is normal.     Breath sounds: Normal breath sounds.  Abdominal:     General: Bowel sounds are normal.     Palpations: Abdomen is soft.  Musculoskeletal:        General: Normal range of motion.     Cervical back: Normal range of motion and neck supple.  Skin:    General:  Skin is warm.  Neurological:     Mental Status: He is alert.     Comments: No facial droop.  Patient has normal sensation and motor strength bilateral arms and legs.  Normal finger-nose bilaterally.  Psychiatric:        Mood and Affect: Mood normal.        Behavior: Behavior normal.     ED Results / Procedures / Treatments   Labs (all labs ordered are listed, but only abnormal results are displayed) Labs Reviewed  BASIC METABOLIC PANEL  CBC  TROPONIN I (HIGH SENSITIVITY)  TROPONIN I (HIGH SENSITIVITY)    EKG EKG Interpretation  Date/Time:  Thursday October 11 2022 17:13:13 EDT Ventricular Rate:  89 PR Interval:  145 QRS Duration: 100 QT Interval:  354 QTC Calculation: 431 R Axis:   210 Text Interpretation: Sinus rhythm Right axis deviation Low voltage, precordial leads No significant change since last tracing Confirmed by Richardean Canal [phone]) on 10/11/2022 7:04:19 PM  Radiology DG Chest 2 View  Result Date: 10/11/2022 CLINICAL DATA:  Chest pain. EXAM: CHEST - 2 VIEW COMPARISON:  July 24 2022. FINDINGS: The heart size and mediastinal contours are within normal limits. Both lungs are clear. The visualized skeletal structures are unremarkable. IMPRESSION: No active cardiopulmonary disease. Electronically Signed   By: Lupita Raider M.D.   On: 10/11/2022 17:39    Procedures Procedures    Medications Ordered in ED Medications  aspirin tablet 325 mg (325 mg Oral Given 10/11/22 1936)    ED Course/ Medical Decision Making/ A&P                             Medical Decision Making YADIR ZENTNER is a 37 y.o. male here presenting with left arm numbness and chest pain.  Left arm numbness is a recurrent problem and he had multiple evaluations including multiple MRIs for this problem.  I do not think he has an acute stroke.  Unfortunately he does have a loop recorder now and I do not think he can get MRI with that.  Will order a CTA and does not show any LVO, I do not think he  needs an MRI.  Consider ACS as well so we will get troponin x 2.  I do not think he has a dissection or PE.  9:17 PM Trop neg x 2. Labs unremarkable. CTA unremarkable. I think he is stable for discharge and can follow up with neurology and cardiology   Problems Addressed: Arm numbness left: acute illness or injury Chest pain, unspecified type: acute illness or injury  Amount and/or Complexity of Data Reviewed Labs: ordered. Decision-making details documented in ED Course. Radiology: ordered and independent interpretation performed. Decision-making details documented in ED Course. ECG/medicine tests: ordered and independent interpretation performed. Decision-making details documented in ED Course.  Risk OTC drugs. Prescription drug management.    Final Clinical Impression(s) / ED Diagnoses Final diagnoses:  None    Rx / DC Orders ED Discharge Orders     None         Charlynne Pander, MD 10/11/22 2118

## 2022-10-11 NOTE — ED Provider Triage Note (Signed)
Emergency Medicine Provider Triage Evaluation Note  Terry Todd , a 37 y.o. male  was evaluated in triage.  Pt complains of chest pain  pt has aloo recorder.  Pain radiation down arm  Review of Systems  Positive: Pain Negative:no fever or cough  Physical Exam  BP (!) 157/87 (BP Location: Left Arm)   Pulse 92   Temp 98.1 F (36.7 C) (Oral)   Resp 19   Ht  (1.803 m)   Wt 122.5 kg   SpO2 97%   BMI 37.66 kg/m  Gen:   Awake, no distress   Resp:  Normal effort  MSK:   Moves extremities without difficulty  Other:    Medical Decision Making  Medically screening exam initiated at 5:22 PM.  Appropriate orders placed.  [name] was informed that the remainder of the evaluation will be completed by another provider, this initial triage assessment does not replace that evaluation, and the importance of remaining in the ED until their evaluation is complete.     Elson Areas, [place] 10/11/22 1723

## 2022-10-11 NOTE — Discharge Instructions (Signed)
Your labs and CT scan were unremarkable   See your cardiologist and neurologist for follow up   Return to ER if you have worse chest pain, arm numbness or weakness

## 2022-10-11 NOTE — ED Notes (Signed)
Pt resting in bed with no acute distress noted at this time.  

## 2022-10-17 DIAGNOSIS — J020 Streptococcal pharyngitis: Secondary | ICD-10-CM | POA: Diagnosis not present

## 2022-10-23 DIAGNOSIS — G43909 Migraine, unspecified, not intractable, without status migrainosus: Secondary | ICD-10-CM | POA: Diagnosis not present

## 2022-10-23 DIAGNOSIS — M542 Cervicalgia: Secondary | ICD-10-CM | POA: Diagnosis not present

## 2022-10-23 DIAGNOSIS — M545 Low back pain, unspecified: Secondary | ICD-10-CM | POA: Diagnosis not present

## 2022-10-23 DIAGNOSIS — I10 Essential (primary) hypertension: Secondary | ICD-10-CM | POA: Diagnosis not present

## 2022-10-25 DIAGNOSIS — M791 Myalgia, unspecified site: Secondary | ICD-10-CM | POA: Diagnosis not present

## 2022-10-25 DIAGNOSIS — R5383 Other fatigue: Secondary | ICD-10-CM | POA: Diagnosis not present

## 2022-10-25 DIAGNOSIS — E559 Vitamin D deficiency, unspecified: Secondary | ICD-10-CM | POA: Diagnosis not present

## 2022-10-25 DIAGNOSIS — M2550 Pain in unspecified joint: Secondary | ICD-10-CM | POA: Diagnosis not present

## 2022-10-25 DIAGNOSIS — R350 Frequency of micturition: Secondary | ICD-10-CM | POA: Diagnosis not present

## 2022-10-25 DIAGNOSIS — M5459 Other low back pain: Secondary | ICD-10-CM | POA: Diagnosis not present

## 2022-10-25 DIAGNOSIS — M545 Low back pain, unspecified: Secondary | ICD-10-CM | POA: Diagnosis not present

## 2022-10-25 NOTE — Progress Notes (Signed)
10/26/2022 Terry Todd [id number] Apr 15, 1986  Referring provider: Philemon Kingdom, Todd Primary GI doctor: Dr. Adela Lank  ASSESSMENT AND PLAN:   GERD without esophagitis AB [place] negative 2022 EGD normal 2022 Check labs, increase protonix 40 mg twice a day, take 30-1 hour before food.  Diet information given, weight loss discussed  LUQ AB pain associated with constipation No weight loss, no blood in stool.  Seems more baseline constipation that is worse, can be from depakote, decrease movement.  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   Elevated transaminase level Check LFTS Weight loss discussed  Other migraine without status migrainosus, not intractable Follow up with neuro  Mild asthma without complication, unspecified whether persistent Follow up with pulmonary Increase PPI BID x 1 months   Patient Care Team: Terry Todd as PCP - General (Internal Medicine) Terry Todd as PCP - Cardiology (Cardiology)  HISTORY OF PRESENT ILLNESS: 37 y.o. male with a past medical history of GERD, TIA versus complicated migraine, elevated LFTs and others listed below presents for evaluation of abdominal pain, nausea, diarrhea, reflux.   2019/2018 positive giardia, has had stomach issues since that time.  03/23/2021 endoscopy with Dr. Adela Lank due to history of dysphagia showed normal esophagus, normal stomach normal duodenum all biopsied and were unremarkable. 02/2021 RUQ [place] without gallstones, fatty liver CT chest AB and pelvis 11/2021 for AB pain 1. No acute intrathoracic, abdominal, or pelvic pathology. 2. Fatty liver. 3.High attenuating content within the gallbladder, likely vicariously excreted contrast from recent administration  Last year patient diagnosed with TIA versus complicated  migraine, had negative MRI brain, negative Holter monitor, loop recorder placed last week, echocardiogram unremarkable and negative for PFO. 10/11/2022 CT angio neck and head stable negative CTA. Negative hypercoagulable disorder workup  He states about 1-2 months ago he has started to have sharp upper left sided AB pain associated with constipation which is his baseline 1-2 x a week, associated with nausea, increase gas, no vomiting, no fever, chills.   It lasted for 1-2 days.  Since that time, he states his BM has changed.  He still has left upper AB pain, can last 30-45 mins, once every 2 weeks, will have loose stools and better after BM.  Now having BM once a week, loose to hard stools.  No melena, no hematochezia.  Denies weight loss.  He has had worsening reflux. He does have wheezing, coughing.   He is on protonix 40 mg daily for at least a year.  He started on depakote of June of last year.  Started on ubrevly since November as needed every other week.  Feels headaches have gotten worse last 6 months.  No NSAIDS. No ETOH, no drug use, no smoking.  He denies blood thinner use.    He  reports that he has never smoked. He has never used smokeless tobacco. He reports that he does not drink alcohol and does not use drugs.  RELEVANT LABS AND IMAGING: CBC    Component Value Date/Time   WBC 10.1 10/11/2022 1757   RBC 4.86 10/11/2022 1757   HGB 14.7 10/11/2022 1757   HGB 14.0 01/10/2021 1030   HCT 43.3 10/11/2022 1757   HCT 41.9 01/10/2021 1030   PLT 362 10/11/2022 1757   PLT 319 01/10/2021 1030   MCV 89.1 10/11/2022 1757   MCV 89  01/10/2021 1030   MCH 30.2 10/11/2022 1757   MCHC 33.9 10/11/2022 1757   RDW 12.9 10/11/2022 1757   RDW 13.1 01/10/2021 1030   LYMPHSABS 3.3 03/11/2022 1538   LYMPHSABS 2.5 01/10/2021 1030   MONOABS 0.9 03/11/2022 1538   EOSABS 0.3 03/11/2022 1538   EOSABS 0.2 01/10/2021 1030   BASOSABS 0.1 03/11/2022 1538   BASOSABS 0.0 01/10/2021 1030    Recent Labs    12/13/21 0509 03/11/22 1538 10/11/22 1757  HGB 13.7 13.8 14.7    CMP     Component Value Date/Time   NA 139 10/11/2022 1757   NA 142 01/10/2021 1030   K 3.9 10/11/2022 1757   CL 106 10/11/2022 1757   CO2 24 10/11/2022 1757   GLUCOSE 90 10/11/2022 1757   BUN 10 10/11/2022 1757   BUN 8 01/10/2021 1030   CREATININE 1.12 10/11/2022 1757   CALCIUM 9.2 10/11/2022 1757   PROT 7.1 03/11/2022 1538   PROT 7.0 01/10/2021 1030   ALBUMIN 3.9 03/11/2022 1538   ALBUMIN 4.5 01/10/2021 1030   AST 23 03/11/2022 1538   ALT 32 03/11/2022 1538   ALKPHOS 60 03/11/2022 1538   BILITOT 0.5 03/11/2022 1538   BILITOT 0.5 01/10/2021 1030   GFRNONAA >60 10/11/2022 1757   GFRAA >60 12/05/2019 2202      Latest Ref Rng & Units 03/11/2022    3:38 PM 03/01/2021   11:07 AM 01/10/2021   10:30 AM  Hepatic Function  Total Protein 6.5 - 8.1 g/dL 7.1  7.5  7.0   Albumin 3.5 - 5.0 g/dL 3.9  4.5  4.5   AST 15 - 41 U/L 23  24  32   ALT 0 - 44 U/L 32  51  65   Alk Phosphatase 38 - 126 U/L 60  55  71   Total Bilirubin 0.3 - 1.2 mg/dL 0.5  0.6  0.5   Bilirubin, Direct 0.0 - 0.3 mg/dL  0.1        Current Medications:     Current Outpatient Medications (Respiratory):    albuterol (VENTOLIN HFA) 108 (90 Base) MCG/ACT inhaler, Inhale 2 puffs into the lungs every 4 (four) hours as needed for shortness of breath.   Azelastine HCl (ASTEPRO) 0.15 % SOLN, Place 1 spray into both nostrils daily as needed (For nasal congestion).   Budeson-Glycopyrrol-Formoterol (BREZTRI AEROSPHERE) 160-9-4.8 MCG/ACT AERO, Inhale 2 puffs into the lungs in the morning and at bedtime.   cetirizine (ZYRTEC ALLERGY) 10 MG tablet, Take 1 tablet (10 mg total) by mouth daily.  Current Outpatient Medications (Analgesics):    ibuprofen (ADVIL) 600 MG tablet, Take 600 mg by mouth daily as needed for mild pain.   Ubrogepant (UBRELVY) 100 MG TABS, Take 100 mg by mouth every 2 (two) hours as needed. Maximum 200mg  a  day.   Current Outpatient Medications (Other):    amoxicillin (AMOXIL) 500 MG tablet, Take 500 mg by mouth 2 (two) times daily.   divalproex (DEPAKOTE ER) 250 MG 24 hr tablet, Take 250 mg by mouth at bedtime.   hyoscyamine (LEVSIN SL) 0.125 MG SL tablet, Place 1 tablet (0.125 mg total) under the tongue every 6 (six) hours as needed for cramping (nausea, diarrhea).   latanoprost (XALATAN) 0.005 % ophthalmic solution, Place 1 drop into both eyes every evening.   lidocaine (LIDODERM) 5 %, 1 patch daily.   linaclotide (LINZESS) 145 MCG CAPS capsule, Take 1 capsule (145 mcg total) by mouth daily before breakfast.   pantoprazole (  PROTONIX) 40 MG tablet, Take 1 tablet (40 mg total) by mouth daily. Please keep your May 3 appointment for further refills   Spacer/Aero-Holding [facility], 1 each by Does not apply route in the morning and at bedtime. Please use with [facility] inhalers   Vitamin D, Ergocalciferol, (DRISDOL) 1.25 MG (50000 UNIT) CAPS capsule, Take 50,000 Units by mouth every 7 (seven) days.  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 Family History:  His family history includes Alcoholism in his mother; Allergies in his father; Asthma in his brother and father; Breast cancer in his maternal grandmother; Cirrhosis in his  mother; Diabetes Mellitus II in his paternal grandfather and paternal grandmother; Hepatitis C in his mother; High Cholesterol in his father; Kidney Stones in his father; Migraines in his brother; Prostate cancer in his paternal grandfather.  REVIEW OF SYSTEMS  : All other systems reviewed and negative except where noted in the History of Present Illness.  PHYSICAL EXAM: BP 124/72   Pulse 70   Ht 5\' 11"  (1.803 m)   Wt 285 lb (129.3 kg)   BMI 39.75 kg/m  General Appearance: Well nourished, in no apparent distress. Head:   Normocephalic and atraumatic. Eyes:  sclerae anicteric,conjunctive pink  Respiratory: Respiratory effort normal, BS equal bilaterally without rales, rhonchi, wheezing. Cardio: RRR with no MRGs. Peripheral pulses intact.  Abdomen: Soft,  Obese ,active bowel sounds. mild tenderness in the LUQ and in the LLQ. Without guarding and Without rebound. No masses. Rectal: Not evaluated Musculoskeletal: Full ROM, Normal gait. Without edema. Skin:  Dry and intact without significant lesions or rashes Neuro: Alert and  oriented x4;  No focal deficits. Psych:  Cooperative. Normal mood and affect.    Doree Albee, PA-C 3:30 PM

## 2022-10-26 ENCOUNTER — Ambulatory Visit: Payer: BC Managed Care – PPO | Admitting: Physician Assistant

## 2022-10-26 ENCOUNTER — Other Ambulatory Visit (INDEPENDENT_AMBULATORY_CARE_PROVIDER_SITE_OTHER): Payer: BC Managed Care – PPO

## 2022-10-26 ENCOUNTER — Ambulatory Visit (INDEPENDENT_AMBULATORY_CARE_PROVIDER_SITE_OTHER)
Admission: RE | Admit: 2022-10-26 | Discharge: 2022-10-26 | Disposition: A | Discharge status: Home / Self Care | Payer: BC Managed Care – PPO | Source: Ambulatory Visit | Attending: Physician Assistant | Admitting: Physician Assistant

## 2022-10-26 VITALS — BP 124/72 | HR 70 | Ht 71.0 in | Wt 285.0 lb

## 2022-10-26 DIAGNOSIS — G43809 Other migraine, not intractable, without status migrainosus: Secondary | ICD-10-CM

## 2022-10-26 DIAGNOSIS — K219 Gastro-esophageal reflux disease without esophagitis: Secondary | ICD-10-CM | POA: Diagnosis not present

## 2022-10-26 DIAGNOSIS — R7401 Elevation of levels of liver transaminase levels: Secondary | ICD-10-CM | POA: Diagnosis not present

## 2022-10-26 DIAGNOSIS — R1012 Left upper quadrant pain: Secondary | ICD-10-CM

## 2022-10-26 DIAGNOSIS — R195 Other fecal abnormalities: Secondary | ICD-10-CM | POA: Diagnosis not present

## 2022-10-26 DIAGNOSIS — K5904 Chronic idiopathic constipation: Secondary | ICD-10-CM

## 2022-10-26 DIAGNOSIS — J45909 Unspecified asthma, uncomplicated: Secondary | ICD-10-CM

## 2022-10-26 LAB — LIPASE: Lipase: 37 U/L (ref 11.0–59.0)

## 2022-10-26 LAB — CBC WITH DIFFERENTIAL/PLATELET
Basophils Absolute: 0.1 10*3/uL (ref 0.0–0.1)
Basophils Relative: 0.8 % (ref 0.0–3.0)
Eosinophils Absolute: 0.2 10*3/uL (ref 0.0–0.7)
Eosinophils Relative: 1.6 % (ref 0.0–5.0)
HCT: 41.7 % (ref 39.0–52.0)
Hemoglobin: 14.4 g/dL (ref 13.0–17.0)
Lymphocytes Relative: 29.9 % (ref 12.0–46.0)
Lymphs Abs: 3.1 10*3/uL (ref 0.7–4.0)
MCHC: 34.5 g/dL (ref 30.0–36.0)
MCV: 87.6 fl (ref 78.0–100.0)
Monocytes Absolute: 1 10*3/uL (ref 0.1–1.0)
Monocytes Relative: 9.5 % (ref 3.0–12.0)
Neutro Abs: 6.1 10*3/uL (ref 1.4–7.7)
Neutrophils Relative %: 58.2 % (ref 43.0–77.0)
Platelets: 302 10*3/uL (ref 150.0–400.0)
RBC: 4.76 Mil/uL (ref 4.22–5.81)
RDW: 13.3 % (ref 11.5–15.5)
WBC: 10.5 10*3/uL (ref 4.0–10.5)

## 2022-10-26 LAB — COMPREHENSIVE METABOLIC PANEL
ALT: 44 U/L (ref 0–53)
AST: 23 U/L (ref 0–37)
Albumin: 4.3 g/dL (ref 3.5–5.2)
Alkaline Phosphatase: 60 U/L (ref 39–117)
BUN: 13 mg/dL (ref 6–23)
CO2: 28 mEq/L (ref 19–32)
Calcium: 9.5 mg/dL (ref 8.4–10.5)
Chloride: 105 mEq/L (ref 96–112)
Creatinine, Ser: 0.9 mg/dL (ref 0.40–1.50)
GFR: 111.22 mL/min (ref >60.00)
Glucose, Bld: 85 mg/dL (ref 70–99)
Potassium: 3.8 mEq/L (ref 3.5–5.1)
Sodium: 143 mEq/L (ref 135–145)
Total Bilirubin: 0.4 mg/dL (ref 0.2–1.2)
Total Protein: 7.4 g/dL (ref 6.0–8.3)

## 2022-10-26 LAB — SEDIMENTATION RATE: Sed Rate: 15 mm/hr (ref 0–15)

## 2022-10-26 LAB — HIGH SENSITIVITY CRP: CRP, High Sensitivity: 3.1 mg/L (ref 0.000–5.000)

## 2022-10-26 LAB — TSH: TSH: 2.17 u[IU]/mL (ref 0.35–5.50)

## 2022-10-26 MED ORDER — HYOSCYAMINE SULFATE 0.125 MG SL SUBL: 0.1250 mg | SUBLINGUAL_TABLET | Freq: Four times a day (QID) | SUBLINGUAL | 1 refills | Status: AC | PRN

## 2022-10-26 MED ORDER — LINACLOTIDE 145 MCG PO CAPS: 145.0000 ug | ORAL_CAPSULE | Freq: Every day | ORAL | 3 refills | Status: DC

## 2022-10-26 NOTE — Progress Notes (Signed)
Agree with assessment and plan as outlined.  

## 2022-10-26 NOTE — Patient Instructions (Addendum)
Your provider has requested that you go to the basement level for lab work before leaving today. Press "B" on the elevator. The lab is located at the first door on the left as you exit the elevator.  Your provider has requested that you have an abdominal x ray before leaving today. Please go to the basement floor to our Radiology department for the test.  Please follow up in 2-3 months.   Please take your proton pump inhibitor medication, protonix 40 mg twice day for 1 month and then back to once day.   Please take this medication 30 minutes to 1 hour before meals- this makes it more effective.  Avoid spicy and acidic foods Avoid fatty foods Limit your intake of coffee, tea, alcohol, and carbonated drinks Work to maintain a healthy weight Keep the head of the bed elevated at least 3 inches with blocks or a wedge pillow if you are having any nighttime symptoms Stay upright for 2 hours after eating Avoid meals and snacks three to four hours before bedtime   Linzess 145 mcg *IBS-C patients may begin to experience relief from belly pain and overall abdominal symptoms (pain, discomfort, and bloating) in about 1 week,  with symptoms typically improving over 12 weeks.  Take at least 30 minutes before the first meal of the day on an empty stomach You can have a loose stool if you eat a high-fat breakfast. Give it at least 7 days, may have more bowel movements during that time.   The diarrhea should go away and you should start having normal, complete, full bowel movements.  It may be helpful to start treatment when you can be near the comfort of your own bathroom, such as a weekend.  After you are out we can send in a prescription if you did well, there is a prescription card  First do a trial off milk/lactose products if you use them.  Add fiber like benefiber or citracel once a day Increase activity Can do trial of IBGard which is over the counter for AB pain- Take 1-2 capsules once a day  for maintence or twice a day during a flare Can send in an anti spasm medication, Levsin, to take as needed Please try to decrease stress. consider talking with PCP about anti anxiety medication or try head space app for meditation. if any worsening symptoms like blood in stool, weight loss, please call the office   Please try low FODMAP diet- see below- start with eliminating just one column at a time, the table at the very bottom contains foods that are safe to take   FODMAP stands for fermentable oligo-, di-, mono-saccharides and polyols (1). These are the scientific terms used to classify groups of carbs that are notorious for triggering digestive symptoms like bloating, gas and stomach pain.

## 2022-10-29 LAB — TRYPTASE: Tryptase: 2.9 ug/L

## 2022-11-05 ENCOUNTER — Telehealth: Payer: Self-pay | Admitting: Cardiology

## 2022-11-05 NOTE — Telephone Encounter (Signed)
Patient called and advised his monitor is connected and scheduled to send tomorrow 11/06/22.0 Patient appreciative of call.

## 2022-11-05 NOTE — Telephone Encounter (Signed)
  1. Has your device fired? no  2. Is you device beeping? no  3. Are you experiencing draining or swelling at device site? no  4. Are you calling to see if we received your device transmission? no  5. Have you passed out? No  Patient calling to find out whether his device transmissions are automatic or if he needs to send them manually and how to do that.     Please route to Device Clinic Pool

## 2022-11-06 ENCOUNTER — Ambulatory Visit (INDEPENDENT_AMBULATORY_CARE_PROVIDER_SITE_OTHER): Payer: BC Managed Care – PPO

## 2022-11-06 DIAGNOSIS — G459 Transient cerebral ischemic attack, unspecified: Secondary | ICD-10-CM | POA: Diagnosis not present

## 2022-11-06 LAB — CUP PACEART REMOTE DEVICE CHECK
Date Time Interrogation Session: 20240514130317
Implantable Pulse Generator Implant Date: 20240408

## 2022-11-08 DIAGNOSIS — Z20822 Contact with and (suspected) exposure to covid-19: Secondary | ICD-10-CM | POA: Diagnosis not present

## 2022-11-08 DIAGNOSIS — R059 Cough, unspecified: Secondary | ICD-10-CM | POA: Diagnosis not present

## 2022-11-08 DIAGNOSIS — J189 Pneumonia, unspecified organism: Secondary | ICD-10-CM | POA: Diagnosis not present

## 2022-11-08 DIAGNOSIS — R509 Fever, unspecified: Secondary | ICD-10-CM | POA: Diagnosis not present

## 2022-12-04 NOTE — Progress Notes (Signed)
[facility] Summary Report / Loop Recorder 

## 2022-12-05 ENCOUNTER — Other Ambulatory Visit: Payer: Self-pay

## 2022-12-05 DIAGNOSIS — R051 Acute cough: Secondary | ICD-10-CM | POA: Diagnosis not present

## 2022-12-05 DIAGNOSIS — R0600 Dyspnea, unspecified: Secondary | ICD-10-CM | POA: Diagnosis not present

## 2022-12-05 DIAGNOSIS — J45998 Other asthma: Secondary | ICD-10-CM | POA: Diagnosis not present

## 2022-12-05 DIAGNOSIS — R079 Chest pain, unspecified: Secondary | ICD-10-CM | POA: Diagnosis not present

## 2022-12-05 DIAGNOSIS — R0602 Shortness of breath: Secondary | ICD-10-CM | POA: Diagnosis not present

## 2022-12-05 MED ORDER — PANTOPRAZOLE SODIUM 40 MG PO TBEC: 40.0000 mg | DELAYED_RELEASE_TABLET | Freq: Two times a day (BID) | ORAL | 5 refills | Status: DC

## 2022-12-10 ENCOUNTER — Ambulatory Visit (INDEPENDENT_AMBULATORY_CARE_PROVIDER_SITE_OTHER): Payer: BC Managed Care – PPO

## 2022-12-10 DIAGNOSIS — G459 Transient cerebral ischemic attack, unspecified: Secondary | ICD-10-CM

## 2022-12-12 LAB — CUP PACEART REMOTE DEVICE CHECK
Date Time Interrogation Session: 20240616231018
Implantable Pulse Generator Implant Date: 20240408

## 2022-12-18 ENCOUNTER — Telehealth: Payer: BC Managed Care – PPO | Admitting: Adult Health

## 2022-12-20 ENCOUNTER — Telehealth: Payer: BC Managed Care – PPO | Admitting: Adult Health

## 2022-12-20 DIAGNOSIS — G43009 Migraine without aura, not intractable, without status migrainosus: Secondary | ICD-10-CM | POA: Diagnosis not present

## 2022-12-20 NOTE — Progress Notes (Addendum)
PATIENT: Terry Todd DOB: 1985-08-10  REASON FOR VISIT: follow up HISTORY FROM: patient  Virtual Visit via Video Note  I connected with Terry Todd on 12/20/22 at 10:30 AM EDT by a video enabled telemedicine application located remotely at [facility] and verified that I am speaking with the correct person using two identifiers who was located at their own home.   I discussed the limitations of evaluation and management by telemedicine and the availability of in person appointments. The patient expressed understanding and agreed to proceed.   PATIENT: Terry Todd DOB: January 18, 1986  REASON FOR VISIT: follow up HISTORY FROM: patient  HISTORY OF PRESENT ILLNESS: Today 12/20/22: Terry Todd is a 37 y.o. male with a history of migraine headaches. Returns today for follow-up. Reports that PCP increased Depakote 500 mg daily d/t him having more migraines. Not sure that the increase has helped. Headache is always on one side of the head. He will get blurry vision. Has 2-3 migraines a month. Takes ubrelvy and headache resolved in 20-30 minutes.     HISTORY June 04, 2022: Patient was seen in October for TIA versus "complicated migraine".  So far workup has been negative.  An extensive panel for hypercoagulable disorders was negative.  Bernita Raisin was approved for migraine acute management.  Testing for PFO was negative.  30-day cardiac monitoring was negative.  I referred him for loop recorder. He has had several bad migraines but didn't try the ubrelvy for a while, next time take the [place] with you everywhere, he has had 2 bad migraines since being seen. The latest one was stress. Acute. No aura with these migraines, were his usual migraines.  He feels tired when he gets up, he had the sleep test this year and it was neg. Will check a tsh, take ubrelvy, continue aspirin,he is following up with an eye doctor he was put on some drops for eye pressure unclear if  glaucoma he is following up soon with them. If pressure is elevated it can cause headaches as well. No other focal neurologic deficits, associated symptoms, inciting events or modifiable factors.     Patient complains of symptoms per HPI as well as the following symptoms: fatigue . Pertinent negatives and positives per HPI. All others negative     HPI 04/11/2022:  Terry Todd is a 37 y.o. male here as requested by Philemon Kingdom, MD for "complicated migraine". PMHx "stroke-like episode" and "complicated migraines", asthma, obesity, GERD, hypertension, palpitations and tachycardia who presented to [facility] with left-sided weakness and numbness in his left face.  NIH was 3 and he was given tPA.  Patient is here alone and he reports he has had migraines most of his life but NEVER AN AURA, going back to middle school, mother had migraines, he has tried shots and everything growing up, in 2017 it started worsening, he has photophobia/phonophobia, nausea, pulsating/pounding/throbbing and always on the right side at the temple and can be moderate to severe can have a lot muscular pain. Moderate to severe. If treated it could last 1-2 hours otherwise could last 24-48 hours with pain. Prior to June 19th never had an aura. Usual frequency was 1-2 a week(6 total migraines a month with < 10 total headache days a month) . June 19th he was at church and they were in the building and he got some pain on the left side of his rib cage, kept getting worse, then he started feeling odd,  felt weak walking left leg and arm and he sat down and his left arm started going numb. A church member felt concerned, he had sensory changes, went to urgent care and had a stroke code at the hospital. He was having a problem answering questions, confusion, never had a heaache during the whole time and no hx of auras. Sleep apnea test was negative. No other focal neurologic deficits, associated symptoms, inciting events or  modifiable factors.   Reviewed notes, labs and imaging from outside physicians, which showed:   CLINICAL DATA:  Provided history: Neuro deficit, acute, stroke suspected.   EXAM: MRI HEAD WITHOUT CONTRAST   TECHNIQUE: Multiplanar, multiecho pulse sequences of the brain and surrounding structures were obtained without intravenous contrast.   COMPARISON:  Same-day brain MRI 12/12/2021. Head CT 12/11/2021.   FINDINGS: Brain:   Cerebral volume is normal.   No cortical encephalomalacia is identified. No significant cerebral white matter disease.   There is no acute infarct.   No evidence of an intracranial mass.   No chronic intracranial blood products.   No extra-axial fluid collection.   No midline shift.   Vascular: Maintained flow voids within the proximal large arterial vessels.   Skull and upper cervical spine: No focal suspicious marrow lesion.   Sinuses/Orbits: No mass or acute finding within the imaged orbits. Small mucous retention cyst within the right maxillary sinus. 2.3 cm mucous retention cyst within the left maxillary sinus. 2.7 cm mucous retention cyst within the left sphenoid sinus.   IMPRESSION: Unremarkable non-contrast MRI appearance of the brain. No evidence of acute intracranial abnormality.   Paranasal sinus mucous retention cysts, as described.   I reviewed notes from the hospital, [facility], he presented to [facility] and was transferred to [facility], he had left-sided weakness and numbness in his left face, he was at church helping with vacation [employer] when a COVID teacher asked him to bring something negative sharp abdominal pain and they brought him to medical urgent care, the urgent care doctor noticed left-sided weakness and sent him to [facility] ED for stroke work-up, tPA was given at 1914 and show NIH was a 3, and he went to [facility].  He endorsed a history of migraines but no previous history of aura or focal  neurologic deficits with his migraines, he still felt some weakness but denied any sensory changes.   From a thorough review of records, medications tried that can be used in headache management include: Fioricet, depakote, lexapro, robaxin, propranolol, amitriptyline, topamax, triptans contraindicated due to possible TIA, maxalt, imitrex oral and Injectable    REVIEW OF SYSTEMS: Out of a complete 14 system review of symptoms, the patient complains only of the following symptoms, and all other reviewed systems are negative.  ALLERGIES: Allergies  Allergen Reactions   Sesame Seed (Diagnostic) Itching    Throat irritation    Shellfish Allergy     unknown    HOME MEDICATIONS: Outpatient Medications Prior to Visit  Medication Sig Dispense Refill   albuterol (VENTOLIN HFA) 108 (90 Base) MCG/ACT inhaler Inhale 2 puffs into the lungs every 4 (four) hours as needed for shortness of breath.     Azelastine HCl (ASTEPRO) 0.15 % SOLN Place 1 spray into both nostrils daily as needed (For nasal congestion).     Budeson-Glycopyrrol-Formoterol (BREZTRI AEROSPHERE) 160-9-4.8 MCG/ACT AERO Inhale 2 puffs into the lungs in the morning and at bedtime. 5.9 g 0   cetirizine (ZYRTEC ALLERGY) 10 MG tablet Take  1 tablet (10 mg total) by mouth daily. 30 tablet 11   divalproex (DEPAKOTE ER) 250 MG 24 hr tablet Take 250 mg by mouth at bedtime.     hyoscyamine (LEVSIN SL) 0.125 MG SL tablet Place 1 tablet (0.125 mg total) under the tongue every 6 (six) hours as needed for cramping (nausea, diarrhea). 50 tablet 1   ibuprofen (ADVIL) 600 MG tablet Take 600 mg by mouth daily as needed for mild pain.     latanoprost (XALATAN) 0.005 % ophthalmic solution Place 1 drop into both eyes every evening.     lidocaine (LIDODERM) 5 % 1 patch daily.     linaclotide (LINZESS) 145 MCG CAPS capsule Take 1 capsule (145 mcg total) by mouth daily before breakfast. 90 capsule 3   pantoprazole (PROTONIX) 40 MG tablet Take 1 tablet (40 mg  total) by mouth 2 (two) times daily before a meal. Take 30 to 60 minutes before a meal 60 tablet 5   Spacer/Aero-Holding Chambers DEVI 1 each by Does not apply route in the morning and at bedtime. Please use with MDI inhalers 1 each 0   Ubrogepant (UBRELVY) 100 MG TABS Take 100 mg by mouth every 2 (two) hours as needed. Maximum 200mg  a day. 16 tablet 11   Vitamin D, Ergocalciferol, (DRISDOL) 1.25 MG (50000 UNIT) CAPS capsule Take 50,000 Units by mouth every 7 (seven) days.     No facility-administered medications prior to visit.    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   Laryngopharyngeal reflux (LPR) 07/21/2019   Migraines 01/04/2020   Obesity (BMI 30-39.9) 01/05/2020   Palpitations 01/05/2020   Right-sided chest wall pain    Sensation of lump in throat 03/27/2018   Shortness of breath 01/05/2020   Sinus arrhythmia 01/05/2020   Tachycardia     PAST SURGICAL HISTORY: Past Surgical History:  Procedure Laterality Date   NO PAST SURGERIES      FAMILY HISTORY: Family History  Problem Relation Age of Onset   Cirrhosis Mother    Hepatitis C Mother    Alcoholism Mother    High Cholesterol Father    Kidney Stones Father    Allergies Father    Asthma Father    Asthma Brother    Migraines Brother    Breast cancer Maternal Grandmother    Diabetes Mellitus II Paternal Grandmother    Diabetes Mellitus II Paternal Grandfather    Prostate cancer Paternal Grandfather    Colon cancer Neg Hx    Esophageal cancer Neg Hx    Pancreatic cancer Neg Hx    Liver cancer Neg Hx    Stomach cancer Neg Hx     SOCIAL HISTORY: Social History   Socioeconomic History   Marital status: Single    Spouse name: Not on file   Number of children: 0   Years of education: Not on file   Highest education level: Not on file   Occupational History    Comment: pastor  Tobacco Use   Smoking status: Never   Smokeless tobacco: Never  Vaping Use   Vaping Use: Never used  Substance and Sexual Activity   Alcohol use: Never   Drug use: Never   Sexual activity: Not on file  Other Topics Concern   Not on file  Social History  Narrative   Lives with roommate   Right handed   Caffeine: 1-2 cups/day   Social Determinants of Health   Financial Resource Strain: Not on file  Food Insecurity: Not on file  Transportation Needs: Not on file  Physical Activity: Not on file  Stress: Not on file  Social Connections: Not on file  Intimate Partner Violence: Not on file      PHYSICAL EXAM Generalized: Well developed, in no acute distress   Neurological examination  Mentation: Alert oriented to time, place, history taking. Follows all commands speech and language fluent Cranial nerve II-XII: Facial symmetry noted   DIAGNOSTIC DATA (LABS, IMAGING, TESTING) - I reviewed patient records, labs, notes, testing and imaging myself where available.  Lab Results  Component Value Date   WBC 10.5 10/26/2022   HGB 14.4 10/26/2022   HCT 41.7 10/26/2022   MCV 87.6 10/26/2022   PLT 302.0 10/26/2022      Component Value Date/Time   NA 143 10/26/2022 1548   NA 142 01/10/2021 1030   K 3.8 10/26/2022 1548   CL 105 10/26/2022 1548   CO2 28 10/26/2022 1548   GLUCOSE 85 10/26/2022 1548   BUN 13 10/26/2022 1548   BUN 8 01/10/2021 1030   CREATININE 0.90 10/26/2022 1548   CALCIUM 9.5 10/26/2022 1548   PROT 7.4 10/26/2022 1548   PROT 7.0 01/10/2021 1030   ALBUMIN 4.3 10/26/2022 1548   ALBUMIN 4.5 01/10/2021 1030   AST 23 10/26/2022 1548   ALT 44 10/26/2022 1548   ALKPHOS 60 10/26/2022 1548   BILITOT 0.4 10/26/2022 1548   BILITOT 0.5 01/10/2021 1030   GFRNONAA >60 10/11/2022 1757   GFRAA >60 12/05/2019 2202   Lab Results  Component Value Date   CHOL 149 12/13/2021   HDL 33 (L) 12/13/2021   LDLCALC 88 12/13/2021    TRIG 139 12/13/2021   CHOLHDL 4.5 12/13/2021   Lab Results  Component Value Date   HGBA1C 5.1 12/13/2021   No results found for: "VITAMINB12" Lab Results  Component Value Date   TSH 2.17 10/26/2022      ASSESSMENT AND PLAN 37 y.o. 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 here with:  Migraine headaches  -Currently migraines are infrequent.  Will continue using Maxalt for abortive therapy.   -PCP is prescribing Depakote 500 mg daily.  This could potentially be increased if migraine frequency increases -Follow-up in 1 year or sooner if needed     Butch Penny, MSN, NP-C 12/20/2022, 10:11 AM [facility] [address] [place], [place] [address] [phone]

## 2022-12-31 NOTE — Progress Notes (Signed)
[facility] Summary Report / Loop Recorder 

## 2023-01-01 NOTE — Progress Notes (Deleted)
01/01/2023 [name] [id number] 09-19-1985  Referring provider: Philemon Kingdom, MD Primary GI doctor: Dr. Adela Lank  ASSESSMENT AND PLAN:      Patient Care Team: Philemon Kingdom, MD as PCP - General (Internal Medicine) [name], DO as PCP - Cardiology (Cardiology)  HISTORY OF PRESENT ILLNESS: 37 y.o. male with a past medical history of GERD, TIA versus complicated migraine, elevated LFTs and others listed below presents for evaluation of abdominal pain, nausea, diarrhea, reflux.   2019/2018 positive giardia, has had stomach issues since that time.  03/23/2021 endoscopy with Dr. Adela Lank due to history of dysphagia showed normal esophagus, normal stomach normal duodenum all biopsied and were unremarkable. 02/2021 RUQ [place] without gallstones, fatty liver CT chest AB and pelvis 11/2021 for AB pain 1. No acute intrathoracic, abdominal, or pelvic pathology. 2. Fatty liver. 3.High attenuating content within the gallbladder, likely vicariously excreted contrast from recent administration 11/15/2022 OV for abdominal pain, nausea diarrhea reflux. Negative CBC, CMET, thyroid, CRP/sed rate, tryptase, lipase. 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 KUB showed fecal loading in the pelvis likely in the rectum or low-lying cecum, started on MiraLAX discussed Linzess samples. If not improving consider colonoscopy.  He states about 1-2 months ago he has started to have sharp upper left sided AB pain associated with constipation which is his baseline 1-2 x a week, associated with nausea, increase gas, no vomiting, no fever, chills.   It lasted for 1-2 days.  Since that time, he states his BM has changed.  He still has left upper AB pain, can last 30-45 mins, once every 2 weeks, will have loose stools and better after BM.  Now having BM once a week, loose to hard stools.  No melena, no hematochezia.  Denies weight loss.  He has had worsening  reflux. He does have wheezing, coughing.   He is on protonix 40 mg daily for at least a year.  He started on depakote of June of last year.  Started on ubrevly since November as needed every other week.  Feels headaches have gotten worse last 6 months.  No NSAIDS. No ETOH, no drug use, no smoking.  He denies blood thinner use.    He  reports that he has never smoked. He has never used smokeless tobacco. He reports that he does not drink alcohol and does not use drugs.  RELEVANT LABS AND IMAGING: CBC    Component Value Date/Time   WBC 10.5 10/26/2022 1548   RBC 4.76 10/26/2022 1548   HGB 14.4 10/26/2022 1548   HGB 14.0 01/10/2021 1030   HCT 41.7 10/26/2022 1548   HCT 41.9 01/10/2021 1030   PLT 302.0 10/26/2022 1548   PLT 319 01/10/2021 1030   MCV 87.6 10/26/2022 1548   MCV 89 01/10/2021 1030   MCH 30.2 10/11/2022 1757   MCHC 34.5 10/26/2022 1548   RDW 13.3 10/26/2022 1548   RDW 13.1 01/10/2021 1030   LYMPHSABS 3.1 10/26/2022 1548   LYMPHSABS 2.5 01/10/2021 1030   MONOABS 1.0 10/26/2022 1548   EOSABS 0.2 10/26/2022 1548   EOSABS 0.2 01/10/2021 1030   BASOSABS 0.1 10/26/2022 1548   BASOSABS 0.0 01/10/2021 1030   Recent Labs    03/11/22 1538 10/11/22 1757 10/26/22 1548  HGB 13.8 14.7 14.4    CMP     Component Value Date/Time   NA 143 10/26/2022 1548   NA 142 01/10/2021 1030   K 3.8 10/26/2022 1548   CL 105 10/26/2022 1548  CO2 28 10/26/2022 1548   GLUCOSE 85 10/26/2022 1548   BUN 13 10/26/2022 1548   BUN 8 01/10/2021 1030   CREATININE 0.90 10/26/2022 1548   CALCIUM 9.5 10/26/2022 1548   PROT 7.4 10/26/2022 1548   PROT 7.0 01/10/2021 1030   ALBUMIN 4.3 10/26/2022 1548   ALBUMIN 4.5 01/10/2021 1030   AST 23 10/26/2022 1548   ALT 44 10/26/2022 1548   ALKPHOS 60 10/26/2022 1548   BILITOT 0.4 10/26/2022 1548   BILITOT 0.5 01/10/2021 1030   GFRNONAA >60 10/11/2022 1757   GFRAA >60 12/05/2019 2202      Latest Ref Rng & Units 10/26/2022    3:48 PM 03/11/2022     3:38 PM 03/01/2021   11:07 AM  Hepatic Function  Total Protein 6.0 - 8.3 g/dL 7.4  7.1  7.5   Albumin 3.5 - 5.2 g/dL 4.3  3.9  4.5   AST 0 - 37 U/L 23  23  24    ALT 0 - 53 U/L 44  32  51   Alk Phosphatase 39 - 117 U/L 60  60  55   Total Bilirubin 0.2 - 1.2 mg/dL 0.4  0.5  0.6   Bilirubin, Direct 0.0 - 0.3 mg/dL   0.1       Current Medications:     Current Outpatient Medications (Respiratory):    albuterol (VENTOLIN HFA) 108 (90 Base) MCG/ACT inhaler, Inhale 2 puffs into the lungs every 4 (four) hours as needed for shortness of breath.   Azelastine HCl (ASTEPRO) 0.15 % SOLN, Place 1 spray into both nostrils daily as needed (For nasal congestion).   Budeson-Glycopyrrol-Formoterol (BREZTRI AEROSPHERE) 160-9-4.8 MCG/ACT AERO, Inhale 2 puffs into the lungs in the morning and at bedtime.   cetirizine (ZYRTEC ALLERGY) 10 MG tablet, Take 1 tablet (10 mg total) by mouth daily.  Current Outpatient Medications (Analgesics):    ibuprofen (ADVIL) 600 MG tablet, Take 600 mg by mouth daily as needed for mild pain.   Ubrogepant (UBRELVY) 100 MG TABS, Take 100 mg by mouth every 2 (two) hours as needed. Maximum 200mg  a day.   Current Outpatient Medications (Other):    divalproex (DEPAKOTE ER) 250 MG 24 hr tablet, Take 250 mg by mouth at bedtime.   hyoscyamine (LEVSIN SL) 0.125 MG SL tablet, Place 1 tablet (0.125 mg total) under the tongue every 6 (six) hours as needed for cramping (nausea, diarrhea).   latanoprost (XALATAN) 0.005 % ophthalmic solution, Place 1 drop into both eyes every evening.   lidocaine (LIDODERM) 5 %, 1 patch daily.   linaclotide (LINZESS) 145 MCG CAPS capsule, Take 1 capsule (145 mcg total) by mouth daily before breakfast.   pantoprazole (PROTONIX) 40 MG tablet, Take 1 tablet (40 mg total) by mouth 2 (two) times daily before a meal. Take 30 to 60 minutes before a meal   Spacer/Aero-Holding Chambers DEVI, 1 each by Does not apply route in the morning and at bedtime. Please use  with MDI inhalers   Vitamin D, Ergocalciferol, (DRISDOL) 1.25 MG (50000 UNIT) CAPS capsule, Take 50,000 Units by mouth every 7 (seven) days.  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 Family History:  His family history includes Alcoholism in his mother; Allergies in his father; Asthma in his brother and father; Breast cancer in his maternal grandmother; Cirrhosis in his mother; Diabetes Mellitus II in his paternal grandfather and paternal grandmother; Hepatitis C in his mother; High Cholesterol in his father; Kidney Stones in his father; Migraines in his brother; Prostate cancer in his paternal grandfather.  REVIEW OF SYSTEMS  : All other systems reviewed and negative except where noted in the History of Present Illness.  PHYSICAL EXAM: There were no vitals taken for this visit. General Appearance: Well nourished, in no apparent distress. Head:   Normocephalic and atraumatic. Eyes:  sclerae anicteric,conjunctive pink  Respiratory: Respiratory effort normal, BS equal bilaterally without rales, rhonchi, wheezing. Cardio: RRR with no MRGs. Peripheral pulses intact.  Abdomen: Soft,  Obese ,active bowel sounds. mild tenderness in the LUQ and in the LLQ. Without guarding and Without rebound. No masses. Rectal: Not  evaluated Musculoskeletal: Full ROM, Normal gait. Without edema. Skin:  Dry and intact without significant lesions or rashes Neuro: Alert and  oriented x4;  No focal deficits. Psych:  Cooperative. Normal mood and affect.    Doree Albee, PA-C 9:09 AM

## 2023-01-03 ENCOUNTER — Ambulatory Visit: Payer: BC Managed Care – PPO | Admitting: Physician Assistant

## 2023-01-04 DIAGNOSIS — F4321 Adjustment disorder with depressed mood: Secondary | ICD-10-CM | POA: Diagnosis not present

## 2023-01-07 DIAGNOSIS — K760 Fatty (change of) liver, not elsewhere classified: Secondary | ICD-10-CM | POA: Diagnosis not present

## 2023-01-07 DIAGNOSIS — R911 Solitary pulmonary nodule: Secondary | ICD-10-CM | POA: Diagnosis not present

## 2023-01-14 ENCOUNTER — Ambulatory Visit (INDEPENDENT_AMBULATORY_CARE_PROVIDER_SITE_OTHER): Payer: BC Managed Care – PPO

## 2023-01-14 DIAGNOSIS — G459 Transient cerebral ischemic attack, unspecified: Secondary | ICD-10-CM

## 2023-01-15 LAB — CUP PACEART REMOTE DEVICE CHECK
Date Time Interrogation Session: 20240719230525
Implantable Pulse Generator Implant Date: 20240408

## 2023-01-30 DIAGNOSIS — F4321 Adjustment disorder with depressed mood: Secondary | ICD-10-CM | POA: Diagnosis not present

## 2023-02-01 NOTE — Progress Notes (Signed)
[facility] Summary Report / Loop Recorder 

## 2023-02-14 LAB — CUP PACEART REMOTE DEVICE CHECK
Date Time Interrogation Session: 20240821231004
Implantable Pulse Generator Implant Date: 20240408

## 2023-02-18 ENCOUNTER — Ambulatory Visit (INDEPENDENT_AMBULATORY_CARE_PROVIDER_SITE_OTHER): Payer: BC Managed Care – PPO

## 2023-02-18 DIAGNOSIS — G459 Transient cerebral ischemic attack, unspecified: Secondary | ICD-10-CM | POA: Diagnosis not present

## 2023-02-27 NOTE — Progress Notes (Signed)
[facility] Summary Report / Loop Recorder 

## 2023-03-08 ENCOUNTER — Encounter: Payer: Self-pay | Admitting: Gastroenterology

## 2023-03-08 ENCOUNTER — Encounter: Payer: Self-pay | Admitting: *Deleted

## 2023-03-08 ENCOUNTER — Ambulatory Visit: Payer: Commercial Managed Care - HMO | Admitting: Gastroenterology

## 2023-03-08 VITALS — BP 116/74 | HR 68 | Ht 71.0 in | Wt 284.4 lb

## 2023-03-08 DIAGNOSIS — K219 Gastro-esophageal reflux disease without esophagitis: Secondary | ICD-10-CM | POA: Diagnosis not present

## 2023-03-08 DIAGNOSIS — K5900 Constipation, unspecified: Secondary | ICD-10-CM | POA: Diagnosis not present

## 2023-03-08 DIAGNOSIS — R1012 Left upper quadrant pain: Secondary | ICD-10-CM | POA: Diagnosis not present

## 2023-03-08 DIAGNOSIS — K760 Fatty (change of) liver, not elsewhere classified: Secondary | ICD-10-CM | POA: Insufficient documentation

## 2023-03-08 DIAGNOSIS — K5904 Chronic idiopathic constipation: Secondary | ICD-10-CM | POA: Insufficient documentation

## 2023-03-08 MED ORDER — LINACLOTIDE 145 MCG PO CAPS: 145.0000 ug | ORAL_CAPSULE | Freq: Every day | ORAL | 5 refills | Status: DC

## 2023-03-08 MED ORDER — FAMOTIDINE 20 MG PO TABS: 20.0000 mg | ORAL_TABLET | Freq: Every day | ORAL | 5 refills | Status: AC

## 2023-03-08 MED ORDER — DEXLANSOPRAZOLE 60 MG PO CPDR: 60.0000 mg | DELAYED_RELEASE_CAPSULE | Freq: Every day | ORAL | 5 refills | Status: DC

## 2023-03-08 NOTE — Progress Notes (Signed)
03/08/2023 GIANNIS LOCHER [id number] September 02, 1985   HISTORY OF PRESENT ILLNESS: This is a 37 year old male with past medical history of GERD, TIA versus complicated migraine, history of elevated ALT with hepatic steatosis who was seen by one of our other PAs in May of this year for complaints of constipation, GERD, left upper quadrant abdominal pain.  She recommended that he increase his pantoprazole to 40 mg twice daily, had given him some samples of Linzess.  He says that despite taking the pantoprazole twice daily he has not had any improvement in his symptoms.  He says that the Linzess did work, but then he ran out of samples.  Looks like they did send a prescription, but he was unaware of that.  They also sent a prescription for Levsin, but he was not aware of that either so did not pick up that or try that.  Extensive labs including CBC, CMP, TSH, sed rate, CRP, lipase, tryptase were all normal.  He has had multiple imaging studies that have shown hepatic steatosis.  Previously ALT was elevated, but it has been normal in the last several sets of labs.  Still has some left upper quadrant abdominal pain, cannot really make any rhyme or reason as to when it occurs, etc.  Reports having been on omeprazole prior to the pantoprazole, which he has been on for well over a year now at this point.  He had a CT of the chest at [facility] that showed no suspicious pulmonary nodules or masses, but they commented on the hepatic steatosis and mildly lobulated contours and prominence of fissures and possibly reflecting underlying early cirrhosis.  His mother did have cirrhosis, and died in her early 30s, but was a heavy drinker.   03/23/2021 endoscopy with Dr. Adela Lank due to history of dysphagia showed normal esophagus, normal stomach normal duodenum all biopsied and were unremarkable. 02/2021 RUQ [place] without gallstones, fatty liver CT chest AB and pelvis 11/2021 for AB pain 1. No acute intrathoracic,  abdominal, or pelvic pathology. 2. Fatty liver. 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 He has never used smokeless tobacco. He reports that he does not drink alcohol and does not use drugs. family history includes Alcoholism in his mother; Allergies in his father; Asthma in his brother and father; Breast cancer in his maternal grandmother; Cirrhosis in his mother; Diabetes Mellitus II in his paternal grandfather and paternal grandmother; Hepatitis C in his mother; High Cholesterol in his father; Kidney Stones in his father; Migraines in his brother; Prostate cancer in his paternal grandfather. Allergies  Allergen Reactions   Sesame Seed (Diagnostic) Itching    Throat irritation    Shellfish Allergy     unknown      Outpatient Encounter Medications as of 03/08/2023  Medication  Sig   albuterol (VENTOLIN HFA) 108 (90 Base) MCG/ACT inhaler Inhale 2 puffs into the lungs every 4 (four) hours as needed for shortness of breath.   Azelastine HCl (ASTEPRO) 0.15 % SOLN Place 1 spray into both nostrils daily as needed (For nasal congestion).   Budeson-Glycopyrrol-Formoterol (BREZTRI AEROSPHERE) 160-9-4.8 MCG/ACT AERO Inhale 2 puffs into the lungs in the morning and at bedtime.   cetirizine (ZYRTEC ALLERGY) 10 MG tablet Take 1 tablet (10 mg  total) by mouth daily.   divalproex (DEPAKOTE ER) 500 MG 24 hr tablet Take 500 mg by mouth daily.   Fluticasone-Salmeterol,sensor, (AIRDUO DIGIHALER) 113-14 MCG/ACT AEPB    hyoscyamine (LEVSIN SL) 0.125 MG SL tablet Place 1 tablet (0.125 mg total) under the tongue every 6 (six) hours as needed for cramping (nausea, diarrhea).   ibuprofen (ADVIL) 600 MG tablet Take 600 mg by mouth daily as needed for mild pain.   latanoprost (XALATAN) 0.005 % ophthalmic solution Place 1 drop into both eyes every evening.   lidocaine (LIDODERM) 5 % 1 patch daily.   linaclotide (LINZESS) 145 MCG CAPS capsule Take 1 capsule (145 mcg total) by mouth daily before breakfast.   methocarbamol (ROBAXIN) 750 MG tablet Take 750 mg by mouth as needed.   naproxen (NAPROSYN) 500 MG tablet Take 500 mg by mouth as needed.   pantoprazole (PROTONIX) 40 MG tablet Take 1 tablet (40 mg total) by mouth 2 (two) times daily before a meal. Take 30 to 60 minutes before a meal   Spacer/Aero-Holding Chambers DEVI 1 each by Does not apply route in the morning and at bedtime. Please use with MDI inhalers   Ubrogepant (UBRELVY) 100 MG TABS Take 100 mg by mouth every 2 (two) hours as needed. Maximum 200mg  a day.   Vitamin D, Ergocalciferol, (DRISDOL) 1.25 MG (50000 UNIT) CAPS capsule Take 50,000 Units by mouth every 7 (seven) days.   [DISCONTINUED] divalproex (DEPAKOTE ER) 250 MG 24 hr tablet Take 250 mg by mouth at bedtime.   No facility-administered encounter medications on file as of 03/08/2023.    REVIEW OF SYSTEMS  : All other systems reviewed and negative except where noted in the History of Present Illness.   PHYSICAL EXAM: BP 116/74 (BP Location: Left Arm, Patient Position: Sitting, Cuff Size: Large)   Pulse 68   Ht 5\' 11"  (1.803 m) Comment: height measured without shoes  Wt 284 lb 6 oz (129 kg)   BMI 39.66 kg/m  General: Well developed male in no acute distress Head: Normocephalic and atraumatic Eyes:  Sclerae anicteric,  conjunctiva pink. Ears: Normal auditory acuity Lungs: Clear throughout to auscultation; no W/R/R. Heart: Regular rate and rhythm; no M/R/G. Musculoskeletal: Symmetrical with no gross deformities  Skin: No lesions on visible extremities Extremities: No edema  Neurological: Alert oriented x 4, grossly non-focal Psychological:  Alert and cooperative. Normal mood and affect  ASSESSMENT AND PLAN: *GERD: Still having symptoms despite pantoprazole 40 mg twice daily.  EGD 2022 normal, without esophagitis.  Will see if we can get Dexilant 60 mg daily and then I would also like him to begin taking famotidine 20 mg daily at bedtime.  Prescription sent to pharmacy. *Constipation: Was improved with taking samples of Linzess 145 mcg daily.  Looks like maybe a prescription was sent to his pharmacy, but he did not realize that and never picked it up and continued it.  Will send a new prescription and give a few more samples today.  Will also instruct him on a  MiraLAX bowel purge for him to do in the next day or 2 as well. *Left upper quadrant abdominal pain: Unsure of the source of this at this point.  Question due to constipation versus some acid related issues.  Will see if it improves with better treatment of the above. *Transaminitis with previously elevated ALT.  This has been normal as of late, multiple imaging studies showing hepatic steatosis.  Recent CT of the chest suggested mildly lobulated contours and prominence of fissures on the liver possibly reflecting underlying early cirrhosis.  Once again we discussed good diet, exercise, weight loss, good blood sugar and cholesterol control, etc.  Will check a ultrasound with elastography.  **Follow-up in 3 to 4 months.   CC:  Philemon Kingdom, MD

## 2023-03-08 NOTE — Progress Notes (Signed)
Agree with assessment and plan as outlined.  If he does not respond to Dexilant then really question if his symptoms are due to reflux or not and if his symptoms persist moving forward despite numerous PPIs then would recommend manometry and 24-hour pH impedance testing to clarify if he is having reflux or not.

## 2023-03-08 NOTE — Patient Instructions (Signed)
Stop Pantoprazole.  We have sent the following medications to your pharmacy for you to pick up at your convenience: Dexilant 60 mg daily.  Pepcid 20 mg nightly  before bedtime.  Linzess 145 mcg daily before breakfast.   You have been scheduled for an abdominal ultrasound at [facility] (1st floor of hospital) on Wednesday 03/13/23 at 9 am. Please arrive 30 minutes prior to your appointment for registration. Make certain not to have anything to eat or drink 6 hours prior to your appointment. Should you need to reschedule your appointment, please contact radiology at [phone]. This test typically takes about 30 minutes to perform.  _______________________________________________________  If your blood pressure at your visit was 140/90 or greater, please contact your primary care physician to follow up on this.  _______________________________________________________  If you are age 35 or older, your body mass index should be between 23-30. Your Body mass index is 39.66 kg/m. If this is out of the aforementioned range listed, please consider follow up with your Primary Care Provider.  If you are age 50 or younger, your body mass index should be between 19-25. Your Body mass index is 39.66 kg/m. If this is out of the aformentioned range listed, please consider follow up with your Primary Care Provider.   ________________________________________________________  The [facility] providers would like to encourage you to use [facility] to communicate with providers for non-urgent requests or questions.  Due to long hold times on the telephone, sending your provider a message by [facility] may be a faster and more efficient way to get a response.  Please allow 48 business hours for a response.  Please remember that this is for non-urgent requests.  _______________________________________________________

## 2023-03-13 ENCOUNTER — Ambulatory Visit (HOSPITAL_COMMUNITY): Payer: Commercial Managed Care - HMO

## 2023-03-19 ENCOUNTER — Ambulatory Visit (HOSPITAL_COMMUNITY)
Admission: RE | Admit: 2023-03-19 | Discharge: 2023-03-19 | Disposition: A | Discharge status: Home / Self Care | Payer: Commercial Managed Care - HMO | Source: Ambulatory Visit | Attending: Gastroenterology | Admitting: Gastroenterology

## 2023-03-19 DIAGNOSIS — K760 Fatty (change of) liver, not elsewhere classified: Secondary | ICD-10-CM | POA: Insufficient documentation

## 2023-03-20 LAB — CUP PACEART REMOTE DEVICE CHECK
Date Time Interrogation Session: 20240923230854
Implantable Pulse Generator Implant Date: 20240408

## 2023-03-25 ENCOUNTER — Ambulatory Visit (INDEPENDENT_AMBULATORY_CARE_PROVIDER_SITE_OTHER): Payer: Managed Care, Other (non HMO)

## 2023-03-25 DIAGNOSIS — G459 Transient cerebral ischemic attack, unspecified: Secondary | ICD-10-CM | POA: Diagnosis not present

## 2023-03-26 ENCOUNTER — Encounter (HOSPITAL_COMMUNITY): Payer: Self-pay

## 2023-03-26 ENCOUNTER — Emergency Department (HOSPITAL_COMMUNITY): Payer: Managed Care, Other (non HMO)

## 2023-03-26 ENCOUNTER — Emergency Department (HOSPITAL_COMMUNITY)
Admission: EM | Admit: 2023-03-26 | Discharge: 2023-03-26 | Disposition: A | Discharge status: Home / Self Care | Payer: Managed Care, Other (non HMO) | Attending: Emergency Medicine | Admitting: Emergency Medicine

## 2023-03-26 ENCOUNTER — Other Ambulatory Visit: Payer: Self-pay

## 2023-03-26 DIAGNOSIS — M5412 Radiculopathy, cervical region: Secondary | ICD-10-CM | POA: Insufficient documentation

## 2023-03-26 DIAGNOSIS — R202 Paresthesia of skin: Secondary | ICD-10-CM | POA: Diagnosis not present

## 2023-03-26 DIAGNOSIS — R531 Weakness: Secondary | ICD-10-CM | POA: Diagnosis present

## 2023-03-26 LAB — CBC WITH DIFFERENTIAL/PLATELET
Abs Immature Granulocytes: 0.05 10*3/uL (ref 0.00–0.07)
Basophils Absolute: 0 10*3/uL (ref 0.0–0.1)
Basophils Relative: 0 %
Eosinophils Absolute: 0.2 10*3/uL (ref 0.0–0.5)
Eosinophils Relative: 2 %
HCT: 47 % (ref 39.0–52.0)
Hemoglobin: 15.6 g/dL (ref 13.0–17.0)
Immature Granulocytes: 1 %
Lymphocytes Relative: 34 %
Lymphs Abs: 3.2 10*3/uL (ref 0.7–4.0)
MCH: 30.5 pg (ref 26.0–34.0)
MCHC: 33.2 g/dL (ref 30.0–36.0)
MCV: 91.8 fL (ref 80.0–100.0)
Monocytes Absolute: 0.8 10*3/uL (ref 0.1–1.0)
Monocytes Relative: 8 %
Neutro Abs: 5.1 10*3/uL (ref 1.7–7.7)
Neutrophils Relative %: 55 %
Platelets: 312 10*3/uL (ref 150–400)
RBC: 5.12 MIL/uL (ref 4.22–5.81)
RDW: 12.3 % (ref 11.5–15.5)
WBC: 9.4 10*3/uL (ref 4.0–10.5)
nRBC: 0 % (ref 0.0–0.2)

## 2023-03-26 LAB — I-STAT CHEM 8, ED
BUN: 11 mg/dL (ref 6–20)
Calcium, Ion: 1.18 mmol/L (ref 1.15–1.40)
Chloride: 106 mmol/L (ref 98–111)
Creatinine, Ser: 0.8 mg/dL (ref 0.61–1.24)
Glucose, Bld: 99 mg/dL (ref 70–99)
HCT: 44 % (ref 39.0–52.0)
Hemoglobin: 15 g/dL (ref 13.0–17.0)
Potassium: 4 mmol/L (ref 3.5–5.1)
Sodium: 142 mmol/L (ref 135–145)
TCO2: 24 mmol/L (ref 22–32)

## 2023-03-26 MED ORDER — GADOBUTROL 1 MMOL/ML IV SOLN
10.0000 mL | Freq: Once | INTRAVENOUS | Status: AC | PRN
  Administered 2023-03-26: 10 mL via INTRAVENOUS

## 2023-03-26 MED ORDER — METHOCARBAMOL 500 MG PO TABS: 500.0000 mg | ORAL_TABLET | Freq: Two times a day (BID) | ORAL | 0 refills | Status: DC

## 2023-03-26 MED ORDER — LACTATED RINGERS IV SOLN: INTRAVENOUS | Status: DC

## 2023-03-26 NOTE — ED Provider Notes (Signed)
[place] EMERGENCY DEPARTMENT AT [facility] Provider Note   CSN: [id number] Arrival date & time: 03/26/23  5366     History  Chief Complaint  Patient presents with   Extremity Weakness    Terry Todd is a 37 y.o. male.  37 year old male presents with left-sided weakness that has been present for over a week.  States that he now has left upper extremity paresthesias.  Notes that he has had similar symptoms in the past.  Extensive review of his medical records show that he has not worked up for this with negative results.  He did receive thrombolytics last year and was ultimately diagnosed with possible TIA versus possible migraine.  Patient denies any weakness to his leg.  Denies any gait trouble.  Does note pain at his lower cervical spine which radiates to his left arm.  Denies any headache at this time.  No visual changes.  No nausea or vomiting.       Home Medications Prior to Admission medications   Medication Sig Start Date End Date Taking? Authorizing Provider  albuterol (VENTOLIN HFA) 108 (90 Base) MCG/ACT inhaler Inhale 2 puffs into the lungs every 4 (four) hours as needed for shortness of breath. 10/27/19   [provider]  Azelastine HCl (ASTEPRO) 0.15 % SOLN Place 1 spray into both nostrils daily as needed (For nasal congestion).    [provider]  Budeson-Glycopyrrol-Formoterol (BREZTRI AEROSPHERE) 160-9-4.8 MCG/ACT AERO Inhale 2 puffs into the lungs in the morning and at bedtime. 08/24/21   Omar Person, MD  cetirizine (ZYRTEC ALLERGY) 10 MG tablet Take 1 tablet (10 mg total) by mouth daily. 08/24/21   Omar Person, MD  dexlansoprazole (DEXILANT) 60 MG capsule Take 1 capsule (60 mg total) by mouth daily. 03/08/23   Zehr, Princella Pellegrini, PA-C  divalproex (DEPAKOTE ER) 500 MG 24 hr tablet Take 500 mg by mouth daily. 03/01/23   [provider]  famotidine (PEPCID) 20 MG tablet Take 1 tablet (20 mg total) by mouth at bedtime.  03/08/23   Zehr, Princella Pellegrini, PA-C  Fluticasone-Salmeterol,sensor, (AIRDUO Drain) [phone] MCG/ACT AEPB  01/09/21   [provider]  hyoscyamine (LEVSIN SL) 0.125 MG SL tablet Place 1 tablet (0.125 mg total) under the tongue every 6 (six) hours as needed for cramping (nausea, diarrhea). 10/26/22   Doree Albee, PA-C  ibuprofen (ADVIL) 600 MG tablet Take 600 mg by mouth daily as needed for mild pain. 12/09/19   [provider]  latanoprost (XALATAN) 0.005 % ophthalmic solution Place 1 drop into both eyes every evening. 08/01/21   [provider]  lidocaine (LIDODERM) 5 % 1 patch daily. 11/17/21   [provider]  linaclotide Karlene Einstein) 145 MCG CAPS capsule Take 1 capsule (145 mcg total) by mouth daily before breakfast. 03/08/23 03/02/24  Zehr, Princella Pellegrini, PA-C  methocarbamol (ROBAXIN) 750 MG tablet Take 750 mg by mouth as needed. 10/23/22   [provider]  naproxen (NAPROSYN) 500 MG tablet Take 500 mg by mouth as needed.    [provider]  Spacer/Aero-Holding Chambers DEVI 1 each by Does not apply route in the morning and at bedtime. Please use with MDI inhalers 08/24/21   Omar Person, MD  Ubrogepant (UBRELVY) 100 MG TABS Take 100 mg by mouth every 2 (two) hours as needed. Maximum 200mg  a day. 04/12/22   [name], MD  Vitamin D, Ergocalciferol, (DRISDOL) 1.25 MG (50000 UNIT) CAPS capsule Take 50,000 Units by  mouth every 7 (seven) days. 01/16/21   [provider]      Allergies    Sesame seed (diagnostic) and Shellfish allergy    Review of Systems   Review of Systems  All other systems reviewed and are negative.   Physical Exam Updated Vital Signs BP (!) 175/109 (BP Location: Left Arm)   Pulse 88   Temp 98.6 F (37 C) (Oral)   Resp 16   SpO2 98%  Physical Exam Vitals and nursing note reviewed.  Constitutional:      General: He is not in acute distress.    Appearance: Normal appearance. He is well-developed. He is not  toxic-appearing.  HENT:     Head: Normocephalic and atraumatic.  Eyes:     General: Lids are normal.     Conjunctiva/sclera: Conjunctivae normal.     Pupils: Pupils are equal, round, and reactive to light.  Neck:     Thyroid: No thyroid mass.     Trachea: No tracheal deviation.  Cardiovascular:     Rate and Rhythm: Normal rate and regular rhythm.     Heart sounds: Normal heart sounds. No murmur heard.    No gallop.  Pulmonary:     Effort: Pulmonary effort is normal. No respiratory distress.     Breath sounds: Normal breath sounds. No stridor. No decreased breath sounds, wheezing, rhonchi or rales.  Abdominal:     General: There is no distension.     Palpations: Abdomen is soft.     Tenderness: There is no abdominal tenderness. There is no rebound.  Musculoskeletal:        General: No tenderness. Normal range of motion.     Cervical back: Normal range of motion and neck supple.  Skin:    General: Skin is warm and dry.     Findings: No abrasion or rash.  Neurological:     General: No focal deficit present.     Mental Status: He is alert and oriented to person, place, and time. Mental status is at baseline.     GCS: GCS eye subscore is 4. GCS verbal subscore is 5. GCS motor subscore is 6.     Cranial Nerves: Cranial nerves are intact. No cranial nerve deficit.     Sensory: Sensation is intact. No sensory deficit.     Motor: Motor function is intact.     Coordination: Coordination is intact.     Gait: Gait is intact.  Psychiatric:        Attention and Perception: Attention normal.        Speech: Speech normal.        Behavior: Behavior normal.     ED Results / Procedures / Treatments   Labs (all labs ordered are listed, but only abnormal results are displayed) Labs Reviewed  CBC WITH DIFFERENTIAL/PLATELET  I-STAT CHEM 8, ED    EKG None  Radiology No results found.  Procedures Procedures    Medications Ordered in ED Medications  lactated ringers infusion (has  no administration in time range)    ED Course/ Medical Decision Making/ A&P                                 Medical Decision Making Amount and/or Complexity of Data Reviewed Labs: ordered. Radiology: ordered. ECG/medicine tests: ordered.  Risk Prescription drug management.   Patient here complaining of left arm tingling.  Has had weakness for over a  week.  MRI of brain and cervical spine without evidence of stroke.  Does have some degenerative changes noted at C4-C5 and C5-C6.  Suspect this is the cause of his discomfort.  Will prescribe course of muscle relaxants that he can follow-up with his doctor        Final Clinical Impression(s) / ED Diagnoses Final diagnoses:  None    Rx / DC Orders ED Discharge Orders     None         Lorre Nick, MD 03/26/23 1230

## 2023-03-26 NOTE — ED Triage Notes (Signed)
Patient reports left sided weakness and left leg numbness starting when he woke up at 0630. He also reports left sided stabbing headache and intermittent blurred vision. States he has been having left shoulder pain x "a couple days." Hx of CVA, given TNK last year.   MD made aware and in room.

## 2023-03-27 ENCOUNTER — Telehealth: Payer: Self-pay | Admitting: Pharmacy Technician

## 2023-03-27 NOTE — Telephone Encounter (Signed)
Pharmacy Patient Advocate Encounter   Received notification from CoverMyMeds that prior authorization for PANTOPRAZOLE 40MG  is required/requested.   Insurance verification completed.   The patient is insured through [employer] .   Per test claim: PA required; PA submitted to [employer] via CoverMyMeds Key/confirmation #/EOC [id number] Status is pending

## 2023-04-09 NOTE — Progress Notes (Signed)
[facility] Summary Report / Loop Recorder 

## 2023-04-23 LAB — CUP PACEART REMOTE DEVICE CHECK
Date Time Interrogation Session: 20241026230959
Implantable Pulse Generator Implant Date: 20240408

## 2023-04-29 ENCOUNTER — Ambulatory Visit (INDEPENDENT_AMBULATORY_CARE_PROVIDER_SITE_OTHER): Payer: Managed Care, Other (non HMO)

## 2023-04-29 DIAGNOSIS — G459 Transient cerebral ischemic attack, unspecified: Secondary | ICD-10-CM

## 2023-05-25 DIAGNOSIS — R0781 Pleurodynia: Secondary | ICD-10-CM | POA: Diagnosis not present

## 2023-05-25 DIAGNOSIS — R509 Fever, unspecified: Secondary | ICD-10-CM | POA: Diagnosis not present

## 2023-05-25 DIAGNOSIS — R0602 Shortness of breath: Secondary | ICD-10-CM | POA: Diagnosis not present

## 2023-05-25 DIAGNOSIS — R109 Unspecified abdominal pain: Secondary | ICD-10-CM | POA: Diagnosis not present

## 2023-05-25 DIAGNOSIS — K5900 Constipation, unspecified: Secondary | ICD-10-CM | POA: Diagnosis not present

## 2023-05-27 NOTE — Progress Notes (Signed)
[facility] Summary Report / Loop Recorder 

## 2023-05-28 DIAGNOSIS — Z1331 Encounter for screening for depression: Secondary | ICD-10-CM | POA: Diagnosis not present

## 2023-05-28 DIAGNOSIS — I10 Essential (primary) hypertension: Secondary | ICD-10-CM | POA: Diagnosis not present

## 2023-05-28 DIAGNOSIS — R29898 Other symptoms and signs involving the musculoskeletal system: Secondary | ICD-10-CM | POA: Diagnosis not present

## 2023-05-28 DIAGNOSIS — J4540 Moderate persistent asthma, uncomplicated: Secondary | ICD-10-CM | POA: Diagnosis not present

## 2023-05-28 DIAGNOSIS — Z6839 Body mass index (BMI) 39.0-39.9, adult: Secondary | ICD-10-CM | POA: Diagnosis not present

## 2023-05-28 DIAGNOSIS — Z0000 Encounter for general adult medical examination without abnormal findings: Secondary | ICD-10-CM | POA: Diagnosis not present

## 2023-05-28 DIAGNOSIS — M79602 Pain in left arm: Secondary | ICD-10-CM | POA: Diagnosis not present

## 2023-05-30 DIAGNOSIS — K219 Gastro-esophageal reflux disease without esophagitis: Secondary | ICD-10-CM | POA: Diagnosis not present

## 2023-05-30 DIAGNOSIS — Z79899 Other long term (current) drug therapy: Secondary | ICD-10-CM | POA: Diagnosis not present

## 2023-05-30 DIAGNOSIS — I10 Essential (primary) hypertension: Secondary | ICD-10-CM | POA: Diagnosis not present

## 2023-05-30 DIAGNOSIS — E559 Vitamin D deficiency, unspecified: Secondary | ICD-10-CM | POA: Diagnosis not present

## 2023-06-03 ENCOUNTER — Ambulatory Visit (INDEPENDENT_AMBULATORY_CARE_PROVIDER_SITE_OTHER): Payer: BC Managed Care – PPO

## 2023-06-03 DIAGNOSIS — G459 Transient cerebral ischemic attack, unspecified: Secondary | ICD-10-CM

## 2023-06-04 LAB — CUP PACEART REMOTE DEVICE CHECK
Date Time Interrogation Session: 20241208231734
Implantable Pulse Generator Implant Date: 20240408

## 2023-06-06 NOTE — Progress Notes (Signed)
06/07/2023 Terry Todd [id number] March 04, 1986  Referring provider: Philemon Kingdom, MD Primary GI doctor: Dr. 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  --Continue to work on risk factor modification including diet exercise and control of risk factors including blood sugars.  Other migraine without status migrainosus, not intractable Follow up with neuro On depakote and some improvement, consider topiramate for migraines and weight loss  Mild asthma without complication, unspecified whether persistent Follow up with pulmonary  Morbid obesity  Body mass index is 39.75 kg/m.  -Patient has been advised to make an attempt to improve diet and exercise patterns to aid in weight loss. -Recommended diet heavy in fruits and veggies and low in animal meats, cheeses, and dairy products, appropriate calorie intake    Patient Care Team: Philemon Kingdom, MD as PCP - General (Internal Medicine) [name], DO as PCP - Cardiology (Cardiology)  HISTORY OF PRESENT ILLNESS: 37 y.o. male with a past medical history of GERD, TIA versus complicated migraine, elevated LFTs and others listed below presents for evaluation of  abdominal pain, nausea, diarrhea, reflux.   2019/2018 positive giardia, has had stomach issues since that time.  03/23/2021 endoscopy with Dr. Adela Lank due to history of dysphagia showed normal esophagus, normal stomach normal duodenum all biopsied and were unremarkable. 02/2021 RUQ [place] without gallstones, fatty liver CT chest AB and pelvis 11/2021 for AB pain 1. No acute intrathoracic, abdominal, or pelvic pathology. 2. Fatty liver. 3.High attenuating content within the gallbladder, likely vicariously excreted contrast from recent administration  04 2024 TIA versus complicated migraine, had negative MRI brain, negative Holter monitor, loop recorder placed last week, echocardiogram unremarkable and negative for PFO. 10/11/2022 CT angio neck and head stable negative CTA. Negative hypercoagulable disorder workup 10/26/2022 office visit with myself for GERD, left upper quadrant abdominal discomfort with constipation and elevated liver function.  Patient had normal CBC, c-Met, no evidence of inflammation, lipase, normal thyroid normal tryptase. KUB showed significant fecal loading Patient was given Linzess 145 and Levsin, and increase pantoprazole to twice daily.. 03/08/2023 office visit with Shanda Bumps PA for continuing issues associate with GERD, switched to Dexilant with famotidine. Given MiraLAX purge and instructed to continue Linzess. Had CT chest at [facility] showing possible early cirrhosis Elastography did not show any changes consistent with cirrhosis no chronic liver disease, does show diffuse fatty liver disease continue diet, exercise and weight loss.  Patient presents today for follow-up He was unable to get the dexilant due to insurance.  He continues on the protonix 40 mg twice daily, takes 30 mins an hour before food in addition to pepcid at night.  He states lately reflux has been worse lately, can feel acid in his throat, has some laryngitis in the morning that  improves during the day.  Feels need to clear his throat.  Has some wheezing, no cough at this time.  He does get full easily.  Denies dysphagia, melena.  Denies NSAIDS, denies ETOH, smoking, drug use.   Day before thanksgiving had very sharp/constant AB pain, tried to have BM but was unable to. He states he had 2-3 days of AB pain, went to ER, had constipation.  He did miralax purge after about two days had BM which improved pain.  Has been on magnesium for the past week.  Linzess has not helped his Bm's/symptoms.   No weight loss, no fever, chils, no hematochezia.    He  reports that he has never smoked. He has never used smokeless tobacco. He reports that he does not drink alcohol and does not use drugs.  RELEVANT LABS AND IMAGING: CBC    Component Value Date/Time   WBC 9.4 03/26/2023 0825   RBC 5.12 03/26/2023 0825   HGB 15.0 03/26/2023 0904   HGB 14.0 01/10/2021 1030   HCT 44.0 03/26/2023 0904   HCT 41.9 01/10/2021 1030   PLT 312 03/26/2023 0825   PLT 319 01/10/2021 1030   MCV 91.8 03/26/2023 0825   MCV 89 01/10/2021 1030   MCH 30.5 03/26/2023 0825   MCHC 33.2 03/26/2023 0825   RDW 12.3 03/26/2023 0825   RDW 13.1 01/10/2021 1030   LYMPHSABS 3.2 03/26/2023 0825   LYMPHSABS 2.5 01/10/2021 1030   MONOABS 0.8 03/26/2023 0825   EOSABS 0.2 03/26/2023 0825   EOSABS 0.2 01/10/2021 1030   BASOSABS 0.0 03/26/2023 0825   BASOSABS 0.0 01/10/2021 1030   Recent Labs    10/11/22 1757 10/26/22 1548 03/26/23 0825 03/26/23 0904  HGB 14.7 14.4 15.6 15.0    CMP     Component Value Date/Time   NA 142 03/26/2023 0904   NA 142 01/10/2021 1030   K 4.0 03/26/2023 0904   CL 106 03/26/2023 0904   CO2 28 10/26/2022 1548   GLUCOSE 99 03/26/2023 0904   BUN 11 03/26/2023 0904   BUN 8 01/10/2021 1030   CREATININE 0.80 03/26/2023 0904   CALCIUM 9.5 10/26/2022 1548   PROT 7.4 10/26/2022 1548   PROT 7.0 01/10/2021 1030   ALBUMIN 4.3 10/26/2022 1548   ALBUMIN 4.5 01/10/2021 1030   AST 23 10/26/2022 1548    ALT 44 10/26/2022 1548   ALKPHOS 60 10/26/2022 1548   BILITOT 0.4 10/26/2022 1548   BILITOT 0.5 01/10/2021 1030   GFRNONAA >60 10/11/2022 1757   GFRAA >60 12/05/2019 2202      Latest Ref Rng & Units 10/26/2022    3:48 PM 03/11/2022    3:38 PM 03/01/2021   11:07 AM  Hepatic Function  Total Protein 6.0 - 8.3 g/dL 7.4  7.1  7.5   Albumin 3.5 - 5.2 g/dL 4.3  3.9  4.5   AST 0 - 37 U/L 23  23  24    ALT 0 - 53 U/L 44  32  51   Alk Phosphatase 39 - 117 U/L 60  60  55   Total Bilirubin 0.2 - 1.2 mg/dL 0.4  0.5  0.6   Bilirubin, Direct 0.0 - 0.3 mg/dL   0.1       Current Medications:     Current Outpatient Medications (Respiratory):    albuterol (VENTOLIN HFA) 108 (90 Base) MCG/ACT inhaler, Inhale 2 puffs into the lungs every 4 (four) hours as needed for shortness of breath.   Azelastine HCl (ASTEPRO)  0.15 % SOLN, Place 1 spray into both nostrils daily as needed (For nasal congestion).   Budeson-Glycopyrrol-Formoterol (BREZTRI AEROSPHERE) 160-9-4.8 MCG/ACT AERO, Inhale 2 puffs into the lungs in the morning and at bedtime.   cetirizine (ZYRTEC ALLERGY) 10 MG tablet, Take 1 tablet (10 mg total) by mouth daily.   Fluticasone-Salmeterol,sensor, (AIRDUO DIGIHALER) 113-14 MCG/ACT AEPB,   Current Outpatient Medications (Analgesics):    ibuprofen (ADVIL) 600 MG tablet, Take 600 mg by mouth daily as needed for mild pain.   naproxen (NAPROSYN) 500 MG tablet, Take 500 mg by mouth as needed.   Ubrogepant (UBRELVY) 100 MG TABS, Take 100 mg by mouth every 2 (two) hours as needed. Maximum 200mg  a day.   Current Outpatient Medications (Other):    dexlansoprazole (DEXILANT) 60 MG capsule, Take 1 capsule (60 mg total) by mouth daily.   divalproex (DEPAKOTE ER) 500 MG 24 hr tablet, Take 250 mg by mouth daily.   famotidine (PEPCID) 20 MG tablet, Take 1 tablet (20 mg total) by mouth at bedtime.   hyoscyamine (LEVSIN SL) 0.125 MG SL tablet, Place 1 tablet (0.125 mg total) under the tongue every 6 (six)  hours as needed for cramping (nausea, diarrhea).   latanoprost (XALATAN) 0.005 % ophthalmic solution, Place 1 drop into both eyes every evening.   lidocaine (LIDODERM) 5 %, 1 patch daily.   linaclotide (LINZESS) 145 MCG CAPS capsule, Take 1 capsule (145 mcg total) by mouth daily before breakfast.   methocarbamol (ROBAXIN) 500 MG tablet, Take 1 tablet (500 mg total) by mouth 2 (two) times daily.   methocarbamol (ROBAXIN) 750 MG tablet, Take 750 mg by mouth as needed.   Spacer/Aero-Holding Chambers DEVI, 1 each by Does not apply route in the morning and at bedtime. Please use with MDI inhalers   Vitamin D, Ergocalciferol, (DRISDOL) 1.25 MG (50000 UNIT) CAPS capsule, Take 50,000 Units by mouth every 7 (seven) days.  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 Family History:  His family history includes Alcoholism in his mother; Allergies in his father; Asthma in his brother and father; Breast cancer in his maternal grandmother; Cirrhosis in his mother; Diabetes Mellitus II in his paternal grandfather and paternal grandmother; Hepatitis C in his mother; High Cholesterol in his father; Kidney Stones in his father; Migraines in his brother; Prostate cancer in his paternal grandfather.  REVIEW OF SYSTEMS  :  All other systems reviewed and negative except where noted in the History of Present Illness.  PHYSICAL EXAM: BP (!) 140/90   Pulse 76   Ht 5\' 11"  (1.803 m)   Wt 285 lb (129.3 kg)   BMI 39.75 kg/m  General Appearance: Well nourished, in no apparent distress. Head:  Normocephalic and atraumatic. Eyes:  sclerae anicteric,conjunctive pink  Respiratory: Respiratory effort normal, BS equal bilaterally without rales, rhonchi, wheezing. Cardio: RRR with no MRGs. Peripheral pulses intact.  Abdomen: Soft,  Obese ,active bowel sounds. mild tenderness in the LUQ and in the LLQ. Without guarding and Without rebound. No masses. Rectal: Not evaluated Musculoskeletal: Full ROM, Normal gait. Without edema. Skin:  Dry and intact without significant lesions or rashes Neuro: Alert and  oriented x4;  No focal deficits. Psych:  Cooperative. Normal mood and affect.    Doree Albee, PA-C 9:20 AM

## 2023-06-07 ENCOUNTER — Ambulatory Visit: Payer: BC Managed Care – PPO | Admitting: Physician Assistant

## 2023-06-07 ENCOUNTER — Encounter: Payer: Self-pay | Admitting: Physician Assistant

## 2023-06-07 VITALS — BP 140/90 | HR 76 | Ht 71.0 in | Wt 285.0 lb

## 2023-06-07 DIAGNOSIS — K5900 Constipation, unspecified: Secondary | ICD-10-CM

## 2023-06-07 DIAGNOSIS — K219 Gastro-esophageal reflux disease without esophagitis: Secondary | ICD-10-CM | POA: Diagnosis not present

## 2023-06-07 DIAGNOSIS — J45909 Unspecified asthma, uncomplicated: Secondary | ICD-10-CM

## 2023-06-07 DIAGNOSIS — Z6839 Body mass index (BMI) 39.0-39.9, adult: Secondary | ICD-10-CM

## 2023-06-07 DIAGNOSIS — K760 Fatty (change of) liver, not elsewhere classified: Secondary | ICD-10-CM | POA: Diagnosis not present

## 2023-06-07 DIAGNOSIS — G43809 Other migraine, not intractable, without status migrainosus: Secondary | ICD-10-CM

## 2023-06-07 DIAGNOSIS — E669 Obesity, unspecified: Secondary | ICD-10-CM

## 2023-06-07 DIAGNOSIS — K5904 Chronic idiopathic constipation: Secondary | ICD-10-CM

## 2023-06-07 NOTE — Progress Notes (Signed)
Agree with assessment and plan as outlined.  

## 2023-06-07 NOTE — Patient Instructions (Addendum)
We can refer you to the weight loss clinic if you would like or you can also talk with your primary care about medical options for weight loss.  Topamax, can discuss if can help with migraines and with weight loss.   FIBER SUPPLEMENT You can do metamucil or fibercon once or twice a day but if this causes gas/bloating please switch to Benefiber or Citracel.  Fiber is good for constipation/diarrhea/irritable bowel syndrome.  It can also help with weight loss and can help lower your bad cholesterol (LDL).  Please do 1 TBSP in the morning in water, coffee, or tea.  It can take up to a month before you can see a difference with your bowel movements.  It is cheapest from [employer], [employer], [employer].   Linzess try the 290 mcg samples and let me know if this helps *IBS-C patients may begin to experience relief from belly pain and overall abdominal symptoms (pain, discomfort, and bloating) in about 1 week,  with symptoms typically improving over 12 weeks.  Take at least 30 minutes before the first meal of the day on an empty stomach You can have a loose stool if you eat a high-fat breakfast. Give it at least 7 days, may have more bowel movements during that time.   The diarrhea should go away and you should start having normal, complete, full bowel movements.  It may be helpful to start treatment when you can be near the comfort of your own bathroom, such as a weekend.  After you are out we can send in a prescription if you did well, there is a prescription card  Can do trial of isbrela for IBS constipation IF the linzess does not help Take Ibsrela (tenapanor) 5 to 10 minutes before eating breakfast and dinner. This can make the medication work better for you compared to taking it on an empty stomach Stop if you have any significant diarrhea/dehydration.   Voquezna is for GERD- stop protonix but stay on pepcid Can use [facility] in [place], 50$ or card 25$/   - Can take with or without food - 20 mg  once a day for 8 weeks ( you have 10 days) Let me know if this helps    You have been scheduled for an esophageal manometry at [facility] on 10/23/2022 at 12:30 pm. Please arrive 30 minutes prior to your procedure for registration. You will need to go to outpatient registration (1st floor of the hospital) first. Make certain to bring your insurance cards as well as a complete list of medications.  Please remember the following:  1) Do not take any muscle relaxants, xanax (alprazolam) or ativan for 1 day prior to your test as well as the day of the test.  2) Nothing to eat or drink for 8 hours before your test.  3) Hold all diabetic medications/insulin the morning of the test. You may eat and take your medications after the test.  It will take at least 2 weeks to receive the results of this test from your physician. ------------------------------------------ ABOUT ESOPHAGEAL MANOMETRY Esophageal manometry (muh-NOM-uh-tree) is a test that gauges how well your esophagus works. Your esophagus is the long, muscular tube that connects your throat to your stomach. Esophageal manometry measures the rhythmic muscle contractions (peristalsis) that occur in your esophagus when you swallow. Esophageal manometry also measures the coordination and force exerted by the muscles of your esophagus.  During esophageal manometry, a thin, flexible tube (catheter) that contains sensors is passed  through your nose, down your esophagus and into your stomach. Esophageal manometry can be helpful in diagnosing some mostly uncommon disorders that affect your esophagus.  Why it's done Esophageal manometry is used to evaluate the movement (motility) of food through the esophagus and into the stomach. The test measures how well the circular bands of muscle (sphincters) at the top and bottom of your esophagus open and close, as well as the pressure, strength and pattern of the wave of esophageal muscle contractions  that moves food along.  What you can expect Esophageal manometry is an outpatient procedure done without sedation. Most people tolerate it well. You may be asked to change into a hospital gown before the test starts.  During esophageal manometry  While you are sitting up, a member of your health care team sprays your throat with a numbing medication or puts numbing gel in your nose or both.  A catheter is guided through your nose into your esophagus. The catheter may be sheathed in a water-filled sleeve. It doesn't interfere with your breathing. However, your eyes may water, and you may gag. You may have a slight nosebleed from irritation.  After the catheter is in place, you may be asked to lie on your back on an exam table, or you may be asked to remain seated.  You then swallow small sips of water. As you do, a computer connected to the catheter records the pressure, strength and pattern of your esophageal muscle contractions.  During the test, you'll be asked to breathe slowly and smoothly, remain as still as possible, and swallow only when you're asked to do so.  A member of your health care team may move the catheter down into your stomach while the catheter continues its measurements.  The catheter then is slowly withdrawn. The test usually lasts 20 to 30 minutes.  After esophageal manometry  When your esophageal manometry is complete, you may return to your normal activities  This test typically takes 30-45 minutes to complete. ________________________________________________________________________________   Due to recent changes in healthcare laws, you may see the results of your imaging and laboratory studies on MyChart before your provider has had a chance to review them.  We understand that in some cases there may be results that are confusing or concerning to you. Not all laboratory results come back in the same time frame and the provider may be waiting for multiple results in order  to interpret others.  Please give [place] 48 hours in order for your provider to thoroughly review all the results before contacting the office for clarification of your results.    I appreciate the  opportunity to care for you  Thank You   Marsa Aris , MD

## 2023-06-13 ENCOUNTER — Telehealth: Payer: Self-pay

## 2023-06-13 NOTE — Telephone Encounter (Signed)
Pharmacy Patient Advocate Encounter  Received notification from [facility] that Prior Authorization for Pantoprazole 40mg  has been APPROVED from 06/13/2023 to 06/12/2024

## 2023-06-13 NOTE — Telephone Encounter (Signed)
Pharmacy Patient Advocate Encounter   Received notification from CoverMyMeds that prior authorization for Pantoprazole 40 mg dr tablets is required/requested.   Insurance verification completed.   The patient is insured through [facility] .   Per test claim: PA required; PA submitted to above mentioned insurance via CoverMyMeds Key/confirmation #/EOC [id number] Status is pending

## 2023-06-24 NOTE — Telephone Encounter (Signed)
Please advise 

## 2023-06-27 MED ORDER — VOQUEZNA 10 MG PO TABS: 10.0000 mg | ORAL_TABLET | Freq: Every day | ORAL | 1 refills | Status: AC

## 2023-06-28 DIAGNOSIS — R062 Wheezing: Secondary | ICD-10-CM | POA: Diagnosis not present

## 2023-06-28 DIAGNOSIS — R0981 Nasal congestion: Secondary | ICD-10-CM | POA: Diagnosis not present

## 2023-06-28 DIAGNOSIS — R051 Acute cough: Secondary | ICD-10-CM | POA: Diagnosis not present

## 2023-06-28 DIAGNOSIS — R059 Cough, unspecified: Secondary | ICD-10-CM | POA: Diagnosis not present

## 2023-07-08 ENCOUNTER — Ambulatory Visit (INDEPENDENT_AMBULATORY_CARE_PROVIDER_SITE_OTHER): Payer: BC Managed Care – PPO

## 2023-07-08 DIAGNOSIS — G459 Transient cerebral ischemic attack, unspecified: Secondary | ICD-10-CM | POA: Diagnosis not present

## 2023-07-08 LAB — CUP PACEART REMOTE DEVICE CHECK
Date Time Interrogation Session: 20250112232000
Implantable Pulse Generator Implant Date: 20240408

## 2023-07-10 ENCOUNTER — Telehealth: Payer: Self-pay | Admitting: Neurology

## 2023-07-10 NOTE — Telephone Encounter (Signed)
 Pt cx appt. Will call back to r/s

## 2023-07-11 NOTE — Progress Notes (Signed)
[facility] Summary Report / Loop Recorder 

## 2023-07-11 NOTE — Addendum Note (Signed)
Addended by: Geralyn Flash D on: 07/11/2023 01:58 PM   Modules accepted: Orders

## 2023-07-12 ENCOUNTER — Telehealth: Payer: BC Managed Care – PPO | Admitting: Adult Health

## 2023-08-07 DIAGNOSIS — R29898 Other symptoms and signs involving the musculoskeletal system: Secondary | ICD-10-CM | POA: Diagnosis not present

## 2023-08-07 DIAGNOSIS — M79602 Pain in left arm: Secondary | ICD-10-CM | POA: Diagnosis not present

## 2023-08-07 LAB — CUP PACEART REMOTE DEVICE CHECK
Date Time Interrogation Session: 20250211083614
Implantable Pulse Generator Implant Date: 20240408

## 2023-08-12 ENCOUNTER — Ambulatory Visit: Payer: BC Managed Care – PPO

## 2023-08-12 DIAGNOSIS — G459 Transient cerebral ischemic attack, unspecified: Secondary | ICD-10-CM

## 2023-08-20 NOTE — Progress Notes (Signed)
 Carelink Summary Report / Loop Recorder

## 2023-08-26 DIAGNOSIS — M542 Cervicalgia: Secondary | ICD-10-CM | POA: Diagnosis not present

## 2023-09-13 NOTE — Telephone Encounter (Signed)
 Pharmacy Patient Advocate Encounter  Received notification from [employer] that Prior Authorization for PANTOPRAZOLE 40MG  has been APPROVED from 10.2.24 to 10.2.25   PA #/Case ID/Reference #: [id number]

## 2023-09-16 ENCOUNTER — Ambulatory Visit (INDEPENDENT_AMBULATORY_CARE_PROVIDER_SITE_OTHER): Payer: BC Managed Care – PPO

## 2023-09-16 DIAGNOSIS — G459 Transient cerebral ischemic attack, unspecified: Secondary | ICD-10-CM | POA: Diagnosis not present

## 2023-09-16 LAB — CUP PACEART REMOTE DEVICE CHECK
Date Time Interrogation Session: 20250323231841
Implantable Pulse Generator Implant Date: 20240408

## 2023-09-20 NOTE — Progress Notes (Signed)
 Carelink Summary Report / Loop Recorder

## 2023-09-26 DIAGNOSIS — Z79899 Other long term (current) drug therapy: Secondary | ICD-10-CM | POA: Diagnosis not present

## 2023-09-26 DIAGNOSIS — E785 Hyperlipidemia, unspecified: Secondary | ICD-10-CM | POA: Diagnosis not present

## 2023-10-01 DIAGNOSIS — M549 Dorsalgia, unspecified: Secondary | ICD-10-CM | POA: Diagnosis not present

## 2023-10-01 DIAGNOSIS — R5383 Other fatigue: Secondary | ICD-10-CM | POA: Diagnosis not present

## 2023-10-01 DIAGNOSIS — K219 Gastro-esophageal reflux disease without esophagitis: Secondary | ICD-10-CM | POA: Diagnosis not present

## 2023-10-01 DIAGNOSIS — I10 Essential (primary) hypertension: Secondary | ICD-10-CM | POA: Diagnosis not present

## 2023-10-21 ENCOUNTER — Ambulatory Visit (INDEPENDENT_AMBULATORY_CARE_PROVIDER_SITE_OTHER): Payer: BC Managed Care – PPO

## 2023-10-21 DIAGNOSIS — G459 Transient cerebral ischemic attack, unspecified: Secondary | ICD-10-CM

## 2023-10-21 LAB — CUP PACEART REMOTE DEVICE CHECK
Date Time Interrogation Session: 20250427231906
Implantable Pulse Generator Implant Date: 20240408

## 2023-10-23 ENCOUNTER — Ambulatory Visit (HOSPITAL_COMMUNITY)
Admission: RE | Admit: 2023-10-23 | Discharge: 2023-10-23 | Disposition: A | Discharge status: Home / Self Care | Payer: BC Managed Care – PPO | Attending: Gastroenterology | Admitting: Gastroenterology

## 2023-10-23 ENCOUNTER — Encounter (HOSPITAL_COMMUNITY)
Admission: RE | Disposition: A | Discharge status: Home / Self Care | Payer: Self-pay | Source: Home / Self Care | Attending: Gastroenterology

## 2023-10-23 ENCOUNTER — Encounter (HOSPITAL_COMMUNITY): Payer: Self-pay | Admitting: Gastroenterology

## 2023-10-23 DIAGNOSIS — R12 Heartburn: Secondary | ICD-10-CM

## 2023-10-23 DIAGNOSIS — K219 Gastro-esophageal reflux disease without esophagitis: Secondary | ICD-10-CM | POA: Diagnosis not present

## 2023-10-23 HISTORY — PX: PH IMPEDANCE STUDY: SHX5565

## 2023-10-23 HISTORY — PX: ESOPHAGEAL MANOMETRY: SHX5429

## 2023-10-23 SURGERY — MANOMETRY, ESOPHAGUS

## 2023-10-23 MED ORDER — LIDOCAINE VISCOUS HCL 2 % MT SOLN
OROMUCOSAL | Status: AC
  Filled 2023-10-23: qty 15

## 2023-10-23 SURGICAL SUPPLY — 2 items
FACESHIELD LNG OPTICON STERILE (SAFETY) IMPLANT
GLOVE BIO SURGEON STRL SZ8 (GLOVE) ×2 IMPLANT

## 2023-10-23 NOTE — Progress Notes (Signed)
 Esophageal Manometry done per protocol. Pt tolerated well with out complication. Ph with impedance done per protocol. Pt tolerated well. Instructions given regarding the study and monitor. Pt verbalized understand and return demonstrated use of monitor. Pt will return tomorrow to have probe removed and monitor downloaded.

## 2023-10-24 ENCOUNTER — Encounter (HOSPITAL_COMMUNITY): Payer: Self-pay | Admitting: Gastroenterology

## 2023-11-05 NOTE — Progress Notes (Signed)
 Carelink Summary Report / Loop Recorder

## 2023-11-12 ENCOUNTER — Ambulatory Visit: Payer: Self-pay | Admitting: Gastroenterology

## 2023-11-12 DIAGNOSIS — Z2989 Encounter for other specified prophylactic measures: Secondary | ICD-10-CM | POA: Diagnosis not present

## 2023-11-12 DIAGNOSIS — Z7184 Encounter for health counseling related to travel: Secondary | ICD-10-CM | POA: Diagnosis not present

## 2023-11-12 DIAGNOSIS — Z6839 Body mass index (BMI) 39.0-39.9, adult: Secondary | ICD-10-CM | POA: Diagnosis not present

## 2023-11-12 NOTE — Telephone Encounter (Signed)
 Dr. Nandigam, do you have esophageal mano report for this patient? Completed on 10/23/23? Thanks

## 2023-11-15 DIAGNOSIS — H40051 Ocular hypertension, right eye: Secondary | ICD-10-CM | POA: Diagnosis not present

## 2023-11-21 ENCOUNTER — Ambulatory Visit

## 2023-11-21 DIAGNOSIS — G459 Transient cerebral ischemic attack, unspecified: Secondary | ICD-10-CM | POA: Diagnosis not present

## 2023-11-21 LAB — CUP PACEART REMOTE DEVICE CHECK
Date Time Interrogation Session: 20250528232821
Implantable Pulse Generator Implant Date: 20240408

## 2023-11-22 ENCOUNTER — Ambulatory Visit: Payer: Self-pay | Admitting: Cardiology

## 2023-12-12 NOTE — Progress Notes (Signed)
 Carelink Summary Report / Loop Recorder

## 2023-12-23 ENCOUNTER — Ambulatory Visit: Payer: Self-pay | Admitting: Cardiology

## 2023-12-23 ENCOUNTER — Ambulatory Visit

## 2023-12-23 DIAGNOSIS — R079 Chest pain, unspecified: Secondary | ICD-10-CM | POA: Diagnosis not present

## 2023-12-23 DIAGNOSIS — R82998 Other abnormal findings in urine: Secondary | ICD-10-CM | POA: Diagnosis not present

## 2023-12-23 DIAGNOSIS — G459 Transient cerebral ischemic attack, unspecified: Secondary | ICD-10-CM | POA: Diagnosis not present

## 2023-12-23 DIAGNOSIS — R200 Anesthesia of skin: Secondary | ICD-10-CM | POA: Diagnosis not present

## 2023-12-23 DIAGNOSIS — R300 Dysuria: Secondary | ICD-10-CM | POA: Diagnosis not present

## 2023-12-23 DIAGNOSIS — R202 Paresthesia of skin: Secondary | ICD-10-CM | POA: Diagnosis not present

## 2023-12-23 DIAGNOSIS — J341 Cyst and mucocele of nose and nasal sinus: Secondary | ICD-10-CM | POA: Diagnosis not present

## 2023-12-23 LAB — CUP PACEART REMOTE DEVICE CHECK
Date Time Interrogation Session: 20250629234054
Implantable Pulse Generator Implant Date: 20240408

## 2023-12-23 NOTE — ED Provider Notes (Signed)
 Patient placed in [facility] pathway, seen and evaluated for chief complaint of feels cold on the left arm, and numbness in his foot, using the bathroom a lot, chest pressure and back pain. Numerous vague complaints.    Pertinent exam findings include non-toxic in appearance and speech is clear. Based on initial evaluation, labs are currently indicated and radiology studies are currently indicated as allowed for current processes and treatments as applicable in a triage setting and could be different than if patient were seen in a main treatment area or dependent on labs/imagining after results are displayed.  Patient counseled on process, plan, and necessity for staying for completing the evaluation.   This document serves as a record of services personally performed by Maranda Grate PA-C.   Emergency Department Provider Note  Dragon voice dictation used for charting. Please excuse any grammatical or spelling errors.    Provider at bedside: 12:17 PM  History obtained from the: Patient  History   Chief Complaint  Patient presents with  . Urinary Symptom  . Chest Pain     Patient presents the ED with several concerns.  He states he has not felt well over the past several days.  He also notes decreased urination and is quite adequate p.o. intake over the past 3 days.  He does note some mild burning with urination and an odor change with his urine.  Furthermore, he notes he has some numbness and tingling on the left side of his body which started this morning about 830.  He notes the majority of the tingling was in the left foot but he is also can perceive it in the left arm.  He denies any headache, speech or vision changes or focal weakness.  Patient states that in 2023 he was seen at [facility] for a stroke and received tPA.  He states he was then transferred to [facility].  He states that an abnormal clot was seen.  He then references a migraine and tells me he was  diagnosed with a TIA related to a migraine.  At this time he still has some flushing sensation on the left side of his face as well as tingling in his left arm and left foot.  He indicates he currently has a loop recorder.  He states he has had a previous MRI with a loop recorder but was told to give notification prior to MRIs being done.     Terry Todd is a 38 y.o. male who presents to the ED with complaints of tingling, dysuria    No LMP for male patient.   Past Medical History Medical History[1]  Past Surgical History Surgical History[2]   Medications @AMBHOMEMEDS @   Allergies Allergies[3]   Family History Family History[4]   Social History Tobacco Use: Low Risk  (10/23/2023)   Received from [facility]   Patient History   . Smoking Tobacco Use: Never   . Smokeless Tobacco Use: Never   . Passive Exposure: Not on file    Review of Systems  Review of Systems  Constitutional:  Negative for chills and fever.  HENT:  Negative for congestion, postnasal drip, rhinorrhea and sore throat.   Respiratory:  Negative for cough and shortness of breath.   Cardiovascular:  Negative for chest pain.  Gastrointestinal:  Negative for abdominal pain, diarrhea, nausea and vomiting.  Genitourinary:  Positive for decreased urine volume and dysuria.  Skin:  Negative for rash and wound.  Neurological:  Positive for numbness.  Negative for dizziness, seizures, speech difficulty, weakness and headaches.  Psychiatric/Behavioral:  Negative for confusion.   All other systems reviewed and are negative.    Physical Exam   Vitals:   12/23/23 1036  BP: (!) 159/113  BP Location: Right arm  Patient Position: Sitting  Pulse: 93  Resp: 19  Temp: 99 F (37.2 C)  TempSrc: Oral  SpO2: 100%  Weight: 122 kg (270 lb)  Height: 180.3 cm (5' 11)    Physical Exam Vitals and nursing note reviewed.  Constitutional:      General: He is not in acute distress.    Appearance: Normal  appearance. He is not ill-appearing.  HENT:     Head: Normocephalic and atraumatic.     Mouth/Throat:     Mouth: Mucous membranes are moist.   Eyes:     Extraocular Movements: Extraocular movements intact.     Conjunctiva/sclera: Conjunctivae normal.     Pupils: Pupils are equal, round, and reactive to light.    Cardiovascular:     Rate and Rhythm: Normal rate and regular rhythm.     Pulses: Normal pulses.  Pulmonary:     Effort: Pulmonary effort is normal. No respiratory distress.     Breath sounds: Normal breath sounds. No stridor. No wheezing, rhonchi or rales.  Chest:     Chest wall: No tenderness.  Abdominal:     General: Bowel sounds are normal. There is no distension.     Palpations: Abdomen is soft.     Tenderness: There is no abdominal tenderness. There is no right CVA tenderness, left CVA tenderness, guarding or rebound.   Musculoskeletal:        General: Normal range of motion.     Cervical back: Normal range of motion and neck supple.     Right lower leg: No tenderness.     Left lower leg: No edema.   Skin:    General: Skin is warm and dry.     Capillary Refill: Capillary refill takes less than 2 seconds.   Neurological:     General: No focal deficit present.     Mental Status: He is alert and oriented to person, place, and time.     Cranial Nerves: No cranial nerve deficit.     Sensory: No sensory deficit.     Motor: No weakness.     Comments: Patient alert and oriented x 3.  His speech is clear and fluent.  He demonstrates 5 out of 5 strength x 4 with no sensory deficits.  His cerebellar function testing is normal.    Labs  @[email or web address] @     Radiology   @[email or web address] @  EKG   See ED Course.  ED Course   ED Course as of 12/23/23 1521  Mon Dec 23, 2023  1227 I spoke with the MRI tech who indicates that the patient need to back up his loop recorder but they then can proceed with the MRI [EM]  1229 Urinalysis is crystal-clear.   Chemistry panel is normal.  CBC is normal.  Troponin is normal. [EM]  1427 Troponins are 3 and less than 3. [EM]  1427 Chest x-ray with no significant pathology.  Awaiting MRI report. [EM]    ED Course User Index [EM] Theadore Jama Elizabeth MADISON, MD    Procedure Note  Procedures  Medical Decision Making  Medical Decision Making Differential diagnosis is TIA, CVA, intracranial mass, electrolyte abnormality, urinary tract infection, anxiety, conversion disorder, malingering, among other diagnoses  Problems Addressed:  Dysuria: acute illness or injury Paresthesias: complicated acute illness or injury  Amount and/or Complexity of Data Reviewed Labs: ordered. Decision-making details documented in ED Course. Radiology: ordered. ECG/medicine tests: ordered.      ED Clinical Impression   1. Paresthesias   2. Dysuria     ED Assessment/Plan  Pt is a 39 y.o. male who presented with tingling, dysuria. Pt appears to have negative urinalysis, reassuring labs, negative MRI. Plan is for the patient home to follow-up with his neurologist and primary care provider.  DISCHARGE MEDICATIONS   Medication List     ASK your doctor about these medications    albuterol  HFA 90 mcg/actuation inhaler Commonly known as: PROVENTIL  HFA;VENTOLIN  HFA;PROAIR  HFA Inhale.   ciprofloxacin 500 mg tablet Commonly known as: CIPRO TAKE 1 TABLET BY MOUTH TWICE DAILY FOR 2 WEEKS   methocarbamoL  500 mg tablet Commonly known as: ROBAXIN    montelukast  10 mg tablet Commonly known as: SINGULAIR  Take 10 mg by mouth Once Daily.   omeprazole 40 mg DR capsule Commonly known as: PriLOSEC TAKE 1 CAPSULE(40 MG) BY MOUTH TWICE DAILY BEFORE MEALS   ondansetron  4 mg disintegrating tablet Commonly known as: ZOFRAN -ODT Take 4 mg by mouth every 8 (eight) hours as needed for nausea.   oxyBUTYnin 5 mg tablet Commonly known as: DITROPAN Take 5 mg by mouth nightly.   SUMAtriptan 100 mg tablet Commonly known as:  IMITREX 1 tab PO at headache onset, may repeat in 2 hrs as needed. No more than 2 tabs per day or 4 tabs per wk. (Can dispense #9 if quality limit)        FOLLOW UP Aleck JAYSON Milch, MD [address] [place] [address] [phone]  Schedule an appointment as soon as possible for a visit  For recheck this week  Onetha KATHEE Epp, MD [address] [place] [address] [phone]  Schedule an appointment as soon as possible for a visit  For recheck this week   ED Disposition     ED Disposition  Discharge   Condition  Stable   Comment  --                [1] Past Medical History: Diagnosis Date  . Asthma (CMD)   . Migraine headache   [2] No past surgical history on file. [3] No Known Allergies [4] Family History Problem Relation Name Age of Onset  . Cancer Maternal Grandmother    . Diabetes Maternal Grandmother    . Hypertension Maternal Grandmother    . Hyperlipidemia Father    . Hypertension Father    . Diabetes Maternal Grandfather

## 2024-01-04 ENCOUNTER — Ambulatory Visit (HOSPITAL_BASED_OUTPATIENT_CLINIC_OR_DEPARTMENT_OTHER)
Admission: EM | Admit: 2024-01-04 | Discharge: 2024-01-04 | Disposition: A | Discharge status: Home / Self Care | Attending: Family Medicine | Admitting: Family Medicine

## 2024-01-04 ENCOUNTER — Ambulatory Visit (HOSPITAL_BASED_OUTPATIENT_CLINIC_OR_DEPARTMENT_OTHER)
Admit: 2024-01-04 | Discharge: 2024-01-04 | Disposition: A | Discharge status: Home / Self Care | Attending: Family Medicine | Admitting: Radiology

## 2024-01-04 ENCOUNTER — Encounter (HOSPITAL_BASED_OUTPATIENT_CLINIC_OR_DEPARTMENT_OTHER): Payer: Self-pay | Admitting: Family Medicine

## 2024-01-04 ENCOUNTER — Ambulatory Visit (HOSPITAL_BASED_OUTPATIENT_CLINIC_OR_DEPARTMENT_OTHER): Payer: Self-pay | Admitting: Family Medicine

## 2024-01-04 DIAGNOSIS — M25562 Pain in left knee: Secondary | ICD-10-CM

## 2024-01-04 DIAGNOSIS — M76892 Other specified enthesopathies of left lower limb, excluding foot: Secondary | ICD-10-CM

## 2024-01-04 DIAGNOSIS — M76891 Other specified enthesopathies of right lower limb, excluding foot: Secondary | ICD-10-CM

## 2024-01-04 DIAGNOSIS — M25561 Pain in right knee: Secondary | ICD-10-CM | POA: Diagnosis not present

## 2024-01-04 MED ORDER — LINACLOTIDE 145 MCG PO CAPS: 145.0000 ug | ORAL_CAPSULE | Freq: Every day | ORAL | 5 refills | Status: AC | PRN

## 2024-01-04 MED ORDER — DICLOFENAC SODIUM 75 MG PO TBEC: 75.0000 mg | DELAYED_RELEASE_TABLET | Freq: Two times a day (BID) | ORAL | 0 refills | Status: AC | PRN

## 2024-01-04 NOTE — Progress Notes (Signed)
 I was able to update the patient about these calcifications in quadricep tendon.  Otherwise his x-ray was negative.  Encouraged to follow-up with orthopedic if pain persists.
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
## 2024-01-04 NOTE — Discharge Instructions (Signed)
 Tendinitis of knees: X-rays are normal her neck.  Will update the patient if the radiology review differs.  Try diclofenac , 75 mg, 1 pill twice daily with food as needed for knee pain.  RICE therapy.  Encouraged use of bilateral knee braces.  If knee pain persists, may need to see orthopedist or even consider physical therapy.

## 2024-01-04 NOTE — ED Triage Notes (Signed)
 Triaged by provider

## 2024-01-04 NOTE — Progress Notes (Signed)
 I was able to phone the patient and update him on his results of a negative right knee film.

## 2024-01-10 NOTE — Progress Notes (Signed)
 Carelink Summary Report / Loop Recorder

## 2024-01-23 ENCOUNTER — Ambulatory Visit (INDEPENDENT_AMBULATORY_CARE_PROVIDER_SITE_OTHER)

## 2024-01-23 DIAGNOSIS — G459 Transient cerebral ischemic attack, unspecified: Secondary | ICD-10-CM

## 2024-01-23 LAB — CUP PACEART REMOTE DEVICE CHECK
Date Time Interrogation Session: 20250730233347
Implantable Pulse Generator Implant Date: 20240408

## 2024-01-24 ENCOUNTER — Ambulatory Visit: Payer: Self-pay | Admitting: Cardiology

## 2024-02-11 DIAGNOSIS — R5383 Other fatigue: Secondary | ICD-10-CM | POA: Diagnosis not present

## 2024-02-11 DIAGNOSIS — I10 Essential (primary) hypertension: Secondary | ICD-10-CM | POA: Diagnosis not present

## 2024-02-11 DIAGNOSIS — K219 Gastro-esophageal reflux disease without esophagitis: Secondary | ICD-10-CM | POA: Diagnosis not present

## 2024-02-11 DIAGNOSIS — G43909 Migraine, unspecified, not intractable, without status migrainosus: Secondary | ICD-10-CM | POA: Diagnosis not present

## 2024-02-13 DIAGNOSIS — R12 Heartburn: Secondary | ICD-10-CM

## 2024-02-19 DIAGNOSIS — E785 Hyperlipidemia, unspecified: Secondary | ICD-10-CM | POA: Diagnosis not present

## 2024-02-19 DIAGNOSIS — E559 Vitamin D deficiency, unspecified: Secondary | ICD-10-CM | POA: Diagnosis not present

## 2024-02-19 DIAGNOSIS — Z79899 Other long term (current) drug therapy: Secondary | ICD-10-CM | POA: Diagnosis not present

## 2024-02-19 DIAGNOSIS — I10 Essential (primary) hypertension: Secondary | ICD-10-CM | POA: Diagnosis not present

## 2024-02-20 DIAGNOSIS — F4381 Prolonged grief disorder: Secondary | ICD-10-CM | POA: Diagnosis not present

## 2024-02-20 DIAGNOSIS — F419 Anxiety disorder, unspecified: Secondary | ICD-10-CM | POA: Diagnosis not present

## 2024-02-24 ENCOUNTER — Ambulatory Visit (INDEPENDENT_AMBULATORY_CARE_PROVIDER_SITE_OTHER)

## 2024-02-24 DIAGNOSIS — G459 Transient cerebral ischemic attack, unspecified: Secondary | ICD-10-CM

## 2024-02-26 LAB — CUP PACEART REMOTE DEVICE CHECK
Date Time Interrogation Session: 20250830232046
Implantable Pulse Generator Implant Date: 20240408

## 2024-02-28 ENCOUNTER — Ambulatory Visit: Payer: Self-pay | Admitting: Cardiology

## 2024-03-03 NOTE — Progress Notes (Signed)
 Remote Loop Recorder Transmission

## 2024-03-05 DIAGNOSIS — F419 Anxiety disorder, unspecified: Secondary | ICD-10-CM | POA: Diagnosis not present

## 2024-03-05 DIAGNOSIS — F4381 Prolonged grief disorder: Secondary | ICD-10-CM | POA: Diagnosis not present

## 2024-03-19 DIAGNOSIS — M47812 Spondylosis without myelopathy or radiculopathy, cervical region: Secondary | ICD-10-CM | POA: Diagnosis not present

## 2024-03-19 DIAGNOSIS — F419 Anxiety disorder, unspecified: Secondary | ICD-10-CM | POA: Diagnosis not present

## 2024-03-19 DIAGNOSIS — F4381 Prolonged grief disorder: Secondary | ICD-10-CM | POA: Diagnosis not present

## 2024-03-20 DIAGNOSIS — M47812 Spondylosis without myelopathy or radiculopathy, cervical region: Secondary | ICD-10-CM | POA: Diagnosis not present

## 2024-03-24 NOTE — Progress Notes (Signed)
 Remote Loop Recorder Transmission

## 2024-03-25 NOTE — Progress Notes (Signed)
 Remote Loop Recorder Transmission

## 2024-03-26 ENCOUNTER — Ambulatory Visit

## 2024-03-26 ENCOUNTER — Ambulatory Visit: Payer: Self-pay | Admitting: Cardiology

## 2024-03-26 DIAGNOSIS — G459 Transient cerebral ischemic attack, unspecified: Secondary | ICD-10-CM

## 2024-03-26 LAB — CUP PACEART REMOTE DEVICE CHECK
Date Time Interrogation Session: 20251001232506
Implantable Pulse Generator Implant Date: 20240408

## 2024-03-30 NOTE — Progress Notes (Signed)
 Remote Loop Recorder Transmission

## 2024-04-02 DIAGNOSIS — F419 Anxiety disorder, unspecified: Secondary | ICD-10-CM | POA: Diagnosis not present

## 2024-04-02 DIAGNOSIS — F4381 Prolonged grief disorder: Secondary | ICD-10-CM | POA: Diagnosis not present

## 2024-04-12 DIAGNOSIS — J069 Acute upper respiratory infection, unspecified: Secondary | ICD-10-CM | POA: Diagnosis not present

## 2024-04-12 DIAGNOSIS — R062 Wheezing: Secondary | ICD-10-CM | POA: Diagnosis not present

## 2024-04-16 DIAGNOSIS — F4381 Prolonged grief disorder: Secondary | ICD-10-CM | POA: Diagnosis not present

## 2024-04-16 DIAGNOSIS — F419 Anxiety disorder, unspecified: Secondary | ICD-10-CM | POA: Diagnosis not present

## 2024-04-27 ENCOUNTER — Ambulatory Visit

## 2024-04-27 DIAGNOSIS — G459 Transient cerebral ischemic attack, unspecified: Secondary | ICD-10-CM

## 2024-04-27 LAB — CUP PACEART REMOTE DEVICE CHECK
Date Time Interrogation Session: 20251102233312
Implantable Pulse Generator Implant Date: 20240408

## 2024-04-28 ENCOUNTER — Ambulatory Visit: Payer: Self-pay | Admitting: Cardiology

## 2024-04-29 NOTE — Progress Notes (Signed)
 Remote Loop Recorder Transmission

## 2024-04-30 DIAGNOSIS — F4381 Prolonged grief disorder: Secondary | ICD-10-CM | POA: Diagnosis not present

## 2024-04-30 DIAGNOSIS — F419 Anxiety disorder, unspecified: Secondary | ICD-10-CM | POA: Diagnosis not present

## 2024-05-03 ENCOUNTER — Other Ambulatory Visit: Payer: Self-pay | Admitting: Physician Assistant

## 2024-05-14 DIAGNOSIS — F4381 Prolonged grief disorder: Secondary | ICD-10-CM | POA: Diagnosis not present

## 2024-05-14 DIAGNOSIS — F419 Anxiety disorder, unspecified: Secondary | ICD-10-CM | POA: Diagnosis not present

## 2024-05-28 ENCOUNTER — Encounter

## 2024-05-29 ENCOUNTER — Ambulatory Visit

## 2024-05-29 DIAGNOSIS — G459 Transient cerebral ischemic attack, unspecified: Secondary | ICD-10-CM

## 2024-05-29 LAB — CUP PACEART REMOTE DEVICE CHECK
Date Time Interrogation Session: 20251204233437
Implantable Pulse Generator Implant Date: 20240408

## 2024-06-02 NOTE — Progress Notes (Signed)
 Remote Loop Recorder Transmission

## 2024-06-03 ENCOUNTER — Ambulatory Visit: Payer: Self-pay | Admitting: Cardiology

## 2024-06-04 DIAGNOSIS — F4381 Prolonged grief disorder: Secondary | ICD-10-CM | POA: Diagnosis not present

## 2024-06-04 DIAGNOSIS — F419 Anxiety disorder, unspecified: Secondary | ICD-10-CM | POA: Diagnosis not present

## 2024-06-29 ENCOUNTER — Ambulatory Visit

## 2024-06-29 ENCOUNTER — Encounter

## 2024-06-29 DIAGNOSIS — G459 Transient cerebral ischemic attack, unspecified: Secondary | ICD-10-CM | POA: Diagnosis not present

## 2024-06-29 LAB — CUP PACEART REMOTE DEVICE CHECK
Date Time Interrogation Session: 20260104232737
Implantable Pulse Generator Implant Date: 20240408

## 2024-06-30 ENCOUNTER — Ambulatory Visit: Payer: Self-pay | Admitting: Cardiology

## 2024-07-02 NOTE — Progress Notes (Signed)
 Remote Loop Recorder Transmission

## 2024-07-30 ENCOUNTER — Ambulatory Visit

## 2024-07-30 ENCOUNTER — Encounter

## 2024-07-30 LAB — CUP PACEART REMOTE DEVICE CHECK
Date Time Interrogation Session: 20260204232151
Implantable Pulse Generator Implant Date: 20240408

## 2024-08-30 ENCOUNTER — Ambulatory Visit

## 2024-08-31 ENCOUNTER — Encounter

## 2024-10-01 ENCOUNTER — Encounter

## 2024-11-02 ENCOUNTER — Encounter

## 2024-12-03 ENCOUNTER — Encounter
